# Patient Record
Sex: Male | Born: 1990
Health system: Southern US, Community
[De-identification: ages and names within clinical notes are randomized; demographics above are authoritative.]

## PROBLEM LIST (undated history)

## (undated) DIAGNOSIS — N200 Calculus of kidney: Secondary | ICD-10-CM

## (undated) DIAGNOSIS — G43909 Migraine, unspecified, not intractable, without status migrainosus: Secondary | ICD-10-CM

## (undated) HISTORY — PX: TONSILLECTOMY AND ADENOIDECTOMY: SHX28

## (undated) HISTORY — PX: LITHOTRIPSY: SUR834

---

## 2015-02-28 ENCOUNTER — Emergency Department (HOSPITAL_COMMUNITY)
Admission: EM | Admit: 2015-02-28 | Discharge: 2015-02-28 | Disposition: A | Discharge status: Home / Self Care | Payer: Commercial Managed Care - HMO | Attending: Emergency Medicine | Admitting: Emergency Medicine

## 2015-02-28 ENCOUNTER — Emergency Department (HOSPITAL_COMMUNITY): Payer: Commercial Managed Care - HMO

## 2015-02-28 ENCOUNTER — Encounter (HOSPITAL_COMMUNITY): Payer: Self-pay | Admitting: Emergency Medicine

## 2015-02-28 DIAGNOSIS — R51 Headache: Secondary | ICD-10-CM | POA: Insufficient documentation

## 2015-02-28 DIAGNOSIS — Z8679 Personal history of other diseases of the circulatory system: Secondary | ICD-10-CM | POA: Insufficient documentation

## 2015-02-28 DIAGNOSIS — H53149 Visual discomfort, unspecified: Secondary | ICD-10-CM | POA: Insufficient documentation

## 2015-02-28 DIAGNOSIS — R110 Nausea: Secondary | ICD-10-CM | POA: Diagnosis not present

## 2015-02-28 DIAGNOSIS — R519 Headache, unspecified: Secondary | ICD-10-CM

## 2015-02-28 HISTORY — DX: Migraine, unspecified, not intractable, without status migrainosus: G43.909

## 2015-02-28 MED ORDER — KETOROLAC TROMETHAMINE 30 MG/ML IJ SOLN
30.0000 mg | Freq: Once | INTRAMUSCULAR | Status: AC
  Administered 2015-02-28: 30 mg via INTRAVENOUS
  Filled 2015-02-28: qty 1

## 2015-02-28 MED ORDER — DIPHENHYDRAMINE HCL 50 MG/ML IJ SOLN
25.0000 mg | Freq: Once | INTRAMUSCULAR | Status: AC
  Filled 2015-02-28: qty 1

## 2015-02-28 MED ORDER — METOCLOPRAMIDE HCL 5 MG/ML IJ SOLN
10.0000 mg | INTRAMUSCULAR | Status: AC
  Administered 2015-02-28: 10 mg via INTRAVENOUS
  Filled 2015-02-28: qty 2

## 2015-02-28 MED ORDER — BUTALBITAL-APAP-CAFFEINE 50-325-40 MG PO TABS: 1.0000 | ORAL_TABLET | Freq: Four times a day (QID) | ORAL | Status: AC | PRN

## 2015-02-28 MED ORDER — DIPHENHYDRAMINE HCL 50 MG/ML IJ SOLN
50.0000 mg | Freq: Once | INTRAMUSCULAR | Status: AC
  Administered 2015-02-28: 50 mg via INTRAVENOUS

## 2015-02-28 NOTE — Discharge Instructions (Signed)

## 2015-02-28 NOTE — ED Notes (Signed)
Pt. reports intermittent /progressing migraine headache for 1 month worse this week , denies nausea or vomitting , no blurred vision .

## 2015-02-28 NOTE — ED Notes (Signed)
Pt A&Ox4, ambulatory at d/c with steady gait, NAD, states he has all of his belongings at discharge

## 2015-02-28 NOTE — ED Provider Notes (Signed)
CSN: [id number]     Arrival date & time 02/28/15  2032 History   First MD Initiated Contact with Patient 02/28/15 2101     Chief Complaint  Patient presents with  . Migraine     (Consider location/radiation/quality/duration/timing/severity/associated sxs/prior Treatment) HPI Comments: 24 year old male presents to emergency department for further evaluation of headache. Patient states that he had a history of migraine headaches in high school which are located in the temporal region and relieved with NSAIDs or Tylenol. He reports that, for the last month, he has intermittently been experiencing headaches in the center of his forehead. He states that the headaches are sudden in onset lasting anywhere from 20-60 minutes before spontaneously resolving. Patient states that ibuprofen has not provided any relief of his symptoms. He has had some nausea as well as photophobia. He denies fever, vision loss, tinnitus or hearing loss, difficulty speaking or swallowing, vomiting, extremity numbness/paresthesias, or extremity weakness. He has had no head injury or trauma inciting his symptoms. He states that these headaches do not feel similar to his known headaches from high school. He is not currently being followed by a primary care provider.  Patient is a 24 y.o. male presenting with migraines. The history is provided by the patient. No language interpreter was used.  Migraine Associated symptoms include headaches and nausea.    Past Medical History  Diagnosis Date  . Migraine headache    History reviewed. No pertinent past surgical history. No family history on file. Social History  Substance Use Topics  . Smoking status: None  . Smokeless tobacco: Current User    Types: Chew  . Alcohol Use: Yes    Review of Systems  Eyes: Positive for photophobia.  Gastrointestinal: Positive for nausea.  Neurological: Positive for headaches.  All other systems reviewed and are negative.   Allergies   Reglan  Home Medications   Prior to Admission medications   Medication Sig Start Date End Date Taking? Authorizing Provider  butalbital-acetaminophen-caffeine (FIORICET) 50-325-40 MG tablet Take 1-2 tablets by mouth every 6 (six) hours as needed for headache. Do not take more than 6 tablets total per day 02/28/15 02/28/16  Antony Madura, PA-C   BP 111/64 mmHg  Pulse 64  Temp(Src) 98.3 F (36.8 C) (Oral)  Resp 16  Ht  (1.626 m)  Wt 155 lb (70.308 kg)  BMI 26.59 kg/m2  SpO2 98%   Physical Exam  Constitutional: He is oriented to person, place, and time. He appears well-developed and well-nourished. No distress.  Nontoxic/nonseptic appearing  HENT:  Head: Normocephalic and atraumatic.  Mouth/Throat: Oropharynx is clear and moist. No oropharyngeal exudate.  No hemotympanum bilaterally. Symmetric rise of the uvula with phonation.  Eyes: Conjunctivae and EOM are normal. Pupils are equal, round, and reactive to light. No scleral icterus.  Normal EOMs. No nystagmus noted  Neck: Normal range of motion.  No nuchal rigidity or meningismus  Cardiovascular: Normal rate, regular rhythm and intact distal pulses.   Pulmonary/Chest: Effort normal. No respiratory distress. He has no wheezes.  Respirations even and unlabored  Musculoskeletal: Normal range of motion.  Neurological: He is alert and oriented to person, place, and time. No cranial nerve deficit. He exhibits normal muscle tone. Coordination normal.  GCS 15. Speech is goal oriented. No cranial nerve deficits appreciated; symmetric eyebrow raise, no facial drooping, tongue midline. Patient has equal grip strength bilaterally with 5/5 strength against resistance in all major muscle groups bilaterally. Sensation to light touch intact. Patient moves extremities without  ataxia. Normal finger-nose-finger. No pronator drift. Patient ambulatory with steady gait.  Skin: Skin is warm and dry. No rash noted. He is not diaphoretic. No erythema. No  pallor.  Psychiatric: He has a normal mood and affect. His behavior is normal.  Nursing note and vitals reviewed.   ED Course  Procedures (including critical care time) Labs Review Labs Reviewed - No data to display  Imaging Review Ct Head Wo Contrast  02/28/2015   CLINICAL DATA:  Initial evaluation for acute headache.  EXAM: CT HEAD WITHOUT CONTRAST  TECHNIQUE: Contiguous axial images were obtained from the base of the skull through the vertex without intravenous contrast.  COMPARISON:  None.  FINDINGS: Study is mildly motion degraded.  There is no acute intracranial hemorrhage or infarct. No mass lesion or midline shift. Gray-white matter differentiation is well maintained. Ventricles are normal in size without evidence of hydrocephalus. CSF containing spaces are within normal limits. No extra-axial fluid collection.  The calvarium is intact.  Orbital soft tissues are within normal limits.  The paranasal sinuses and mastoid air cells are well pneumatized and free of fluid.  Scalp soft tissues are unremarkable.  IMPRESSION: Negative head CT with no acute intracranial process identified.   Electronically Signed   By: Rise Mu M.D.   On: 02/28/2015 22:39     I have personally reviewed and evaluated these images and lab results as part of my medical decision-making.   EKG Interpretation None      MDM   Final diagnoses:  Acute nonintractable headache, unspecified headache type    24 year old male presents to the emergency department for further evaluation of headache. Patient states that he has a history of migraine headaches in high school, but these resolved approximately 7 years ago. He states that his headaches over the last month has felt different. They are present in the center of his forehead. Patient has nonfocal neurologic exam today. CT head ordered given the abnormal nature of symptoms compared to prior headaches. CT negative for mass, lesion, hemorrhage, or  hydrocephalus. There is no mass effect.   Patient has had resolution of his headache with Toradol and Reglan. Patient required IV Benadryl for dystonic reaction to Reglan. These have since resolved. Patient states that he is feeling back to normal, baseline. Given persistent nature of headache over the last month, will refer to neurology for further evaluation of symptoms. Patient placed on course of Fioricet for headache management. Return precautions discussed and provided. Patient agreeable to plan with no unaddressed concerns. Patient discharged in good condition.   Filed Vitals:   02/28/15 2115 02/28/15 2130 02/28/15 2200 02/28/15 2245  BP: 129/78 124/75 140/92 111/64  Pulse: 69 62 110 64  Temp:      TempSrc:      Resp:      Height:      Weight:      SpO2: 100% 98% 91% 98%     Antony Madura, PA-C 02/28/15 2314  Eber Hong, MD 03/01/15 [id number]

## 2015-02-28 NOTE — ED Notes (Signed)
Patient unable to lay still, diaphoretic then gets the chills.  Dr Hyacinth Meeker aware

## 2015-08-06 ENCOUNTER — Emergency Department (HOSPITAL_COMMUNITY)
Admission: EM | Admit: 2015-08-06 | Discharge: 2015-08-06 | Disposition: A | Discharge status: Home / Self Care | Payer: Commercial Managed Care - HMO | Source: Home / Self Care | Attending: Family Medicine | Admitting: Family Medicine

## 2015-08-06 ENCOUNTER — Encounter (HOSPITAL_COMMUNITY): Payer: Self-pay | Admitting: Emergency Medicine

## 2015-08-06 DIAGNOSIS — J111 Influenza due to unidentified influenza virus with other respiratory manifestations: Secondary | ICD-10-CM

## 2015-08-06 NOTE — ED Provider Notes (Signed)
CSN: [id number]     Arrival date & time 08/06/15  1320 History   First MD Initiated Contact with Patient 08/06/15 1406     Chief Complaint  Patient presents with  . URI   (Consider location/radiation/quality/duration/timing/severity/associated sxs/prior Treatment) HPI Thomas Barrera is a 25 year old Caucasian male with PMH of migraines, presenting with URI symptoms. Last night he had a fever of 101.19F that was relieved with Tylenol. He also reports chills, fatigue, headache (not relieved by his regular Excedrin), body aches, abdominal pain, nausea, vomiting, loose stools 6 times since last night. He has a mild cough lingering from a cold he had 1 month ago. He denies ear symptoms, rhinorrhea, SOB, chest pain. He has not tried his Fioricet for this current headache.  His wife and 4yo son were diagnosed here Wed and Thurs with the flu and treated with Tamiflu; it seems to have really helped his son. He has an 6340-month old, too.  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   Past Medical History  Diagnosis Date  . Migraine headache    History reviewed. No pertinent past surgical history. No family history on file. Social History  Substance Use Topics  . Smoking status: Never Smoker   . Smokeless tobacco: Current User    Types: Chew  . Alcohol Use: Yes    Review of Systems  Allergies  Reglan  Home Medications   Prior to Admission medications   Medication Sig Start Date End Date Taking? Authorizing Provider  butalbital-acetaminophen-caffeine (FIORICET) 50-325-40 MG tablet Take 1-2 tablets by mouth every 6 (six) hours as needed for headache. Do not take more than 6 tablets total per day 02/28/15 02/28/16  Antony MaduraKelly Humes, PA-C   Meds Ordered and Administered this Visit  Medications - No data to display  BP 134/82 mmHg  Pulse 97  Temp(Src) 98.3 F (36.8 C) (Oral)  Resp 18  SpO2 100% No data found.   Physical Exam Constitutional: Well-appearing male,  pleasant, in no acute distress, appears fatigued HEENT: PERRL; EOMs intact;TM's gray (L TM exhibits old scarring); nasal turbinates not swollen or erythematous; pharynx non-erythematous and no exudates Neck: Positive for mild cervical lymphadenopathy CV: RRR, no murmurs, rubs, gallops Lungs: CTAB, no wheezes/rales/rhonchi Abdomen: Soft and non-distended; no guarding; positive for mild pain in LLQ with deep palpation Skin: No rash noted Psych: Normal mood and affect  ED Course  Procedures (including critical care time)  Labs Review Labs Reviewed - No data to display  Imaging Review No results found.   Visual Acuity Review  Right Eye Distance:   Left Eye Distance:   Bilateral Distance:    Right Eye Near:   Left Eye Near:    Bilateral Near:       Given his positive exposures, it is reasonable to suspect he also has influenza. We discussed that Tamiflu is not recommended as he is not a high-risk patient, and he agreed to trying symptomatic treatment (Tylenol, Ibuprofen, rest).  MDM   1. Flu         Patient is reassured that there are no issues that require transfer to higher level of care at this time or additional tests. Patient is advised to continue home symptomatic treatment. Patient is advised that if there are new or worsening symptoms to attend the emergency department, contact primary care provider, or return to UC. Instructions of care provided discharged home in stable condition. Return to work/school note provided.   THIS NOTE  WAS GENERATED USING A VOICE RECOGNITION SOFTWARE PROGRAM. ALL REASONABLE EFFORTS  WERE MADE TO PROOFREAD THIS DOCUMENT FOR ACCURACY.  I have verbally reviewed the discharge instructions with the patient. A printed AVS was given to the patient.  All questions were answered prior to discharge.      Tharon Aquas, PA 08/06/15 1531

## 2015-08-06 NOTE — Discharge Instructions (Signed)

## 2015-08-06 NOTE — ED Notes (Signed)
Pt reports poss flu like sx onset yest... States son and wife have been dx w/flu Sx today include: intermittent fevers, BA, HA, chills, abd pain, vomiting and feeling tired Taking tylenol w/temp relief.  A&O x4.... No acute distress.

## 2015-09-21 ENCOUNTER — Encounter: Payer: Self-pay | Admitting: Family Medicine

## 2015-09-21 ENCOUNTER — Ambulatory Visit (INDEPENDENT_AMBULATORY_CARE_PROVIDER_SITE_OTHER): Payer: Commercial Managed Care - HMO | Admitting: Family Medicine

## 2015-09-21 VITALS — BP 110/80 | HR 62 | Temp 98.3°F | Resp 14 | Ht 66.0 in | Wt 161.0 lb

## 2015-09-21 DIAGNOSIS — M722 Plantar fascial fibromatosis: Secondary | ICD-10-CM

## 2015-09-21 DIAGNOSIS — Z0000 Encounter for general adult medical examination without abnormal findings: Secondary | ICD-10-CM | POA: Diagnosis not present

## 2015-09-21 DIAGNOSIS — G43009 Migraine without aura, not intractable, without status migrainosus: Secondary | ICD-10-CM | POA: Diagnosis not present

## 2015-09-21 MED ORDER — RIZATRIPTAN BENZOATE 10 MG PO TBDP: 10.0000 mg | ORAL_TABLET | ORAL | Status: AC | PRN

## 2015-09-21 NOTE — Progress Notes (Signed)
Subjective:    Patient ID: Thomas Barrera, male    DOB: [birth year], 25 y.o.   MRN: [id number]  HPI  Patient is a 25 year old male here today to establish care. He is requesting a complete physical exam. He also reports a past medical history significant for migraines. He tends to have 2-3 per month. If he takes Fioricet for migraine subsides immediately. However he hates to take that medication because it gives him diarrhea. He denies any aura but he does report photophobia and a unilateral pulsatile headache. He has had migraines for years. He also complains of pain in his left heel near the insertion of the plantar fascia. It is extremely tender to palpation in this area. It hurts first 20 gives up in the morning. After the first few steps the area limber up and feels sore the rest of the day. He is tried no treatment for the present. Otherwise he is doing well with no complaints Past Medical History  Diagnosis Date  . Migraine headache    History reviewed. No pertinent past surgical history. Current Outpatient Prescriptions on File Prior to Visit  Medication Sig Dispense Refill  . butalbital-acetaminophen-caffeine (FIORICET) 50-325-40 MG tablet Take 1-2 tablets by mouth every 6 (six) hours as needed for headache. Do not take more than 6 tablets total per day 20 tablet 0   No current facility-administered medications on file prior to visit.   Allergies  Allergen Reactions  . Reglan [Metoclopramide] Other (See Comments)   Social History   Social History  . Marital Status: Married    Spouse Name: N/A  . Number of Children: N/A  . Years of Education: N/A   Occupational History  . Not on file.   Social History Main Topics  . Smoking status: Never Smoker   . Smokeless tobacco: Current User    Types: Snuff  . Alcohol Use: 0.0 oz/week    0 Standard drinks or equivalent per week  . Drug Use: No  . Sexual Activity: Yes     Comment: married, fireman   Other Topics Concern  . Not on  file   Social History Narrative   FAMH-not contributory  Review of Systems  All other systems reviewed and are negative.      Objective:   Physical Exam  Constitutional: He is oriented to person, place, and time. He appears well-developed and well-nourished. No distress.  HENT:  Head: Normocephalic and atraumatic.  Right Ear: External ear normal.  Left Ear: External ear normal.  Nose: Nose normal.  Mouth/Throat: Oropharynx is clear and moist. No oropharyngeal exudate.  Eyes: Conjunctivae and EOM are normal. Pupils are equal, round, and reactive to light. Right eye exhibits no discharge. Left eye exhibits no discharge. No scleral icterus.  Neck: Normal range of motion. Neck supple. No JVD present. No tracheal deviation present. No thyromegaly present.  Cardiovascular: Normal rate, regular rhythm, normal heart sounds and intact distal pulses.  Exam reveals no gallop and no friction rub.   No murmur heard. Pulmonary/Chest: Effort normal and breath sounds normal. No stridor. No respiratory distress. He has no wheezes. He has no rales. He exhibits no tenderness.  Abdominal: Soft. Bowel sounds are normal. He exhibits no distension and no mass. There is no tenderness. There is no rebound and no guarding. Hernia confirmed negative in the right inguinal area and confirmed negative in the left inguinal area.  Genitourinary: Testes normal and penis normal. Cremasteric reflex is present. Right testis shows no mass. Left testis  shows no mass.  Musculoskeletal: Normal range of motion. He exhibits no edema.       Left foot: There is tenderness and bony tenderness.  Lymphadenopathy:    He has no cervical adenopathy.       Right: No inguinal adenopathy present.       Left: No inguinal adenopathy present.  Neurological: He is alert and oriented to person, place, and time. He displays normal reflexes. No cranial nerve deficit. He exhibits normal muscle tone. Coordination normal.  Skin: Skin is warm.  No rash noted. He is not diaphoretic. No erythema. No pallor.  Psychiatric: He has a normal mood and affect. His behavior is normal. Judgment and thought content normal.  Vitals reviewed.         Assessment & Plan:  Migraine without aura and without status migrainosus, not intractable - Plan: rizatriptan (MAXALT-MLT) 10 MG disintegrating tablet  Plantar fasciitis of left foot  Routine general medical examination at a health care facility  Exam is completely normal. I believe the pain in his heel is due to plantar fasciitis. I gave him a series of exercises and stretches I would like him to perform at home. If he stretches or not improving things over the next 4-6 weeks, I would recommend a cortisone injection for plantar fascitis versus a podiatry referral. I gave the patient prescription for Maxalt 10 mg oral disintegrating tablets she can take 1 at the first sign of a migraine. Hopefully he will tolerate this medication better than he does Fioricet. If the frequency of the headaches increases, consider Topamax. I would like the patient to get fasting lab work. 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 I have asked him to bring that lab work by us of that I can review it.

## 2015-11-28 ENCOUNTER — Encounter (HOSPITAL_COMMUNITY): Payer: Self-pay | Admitting: Emergency Medicine

## 2015-11-28 ENCOUNTER — Emergency Department (HOSPITAL_COMMUNITY)
Admission: EM | Admit: 2015-11-28 | Discharge: 2015-11-28 | Disposition: A | Discharge status: Home / Self Care | Payer: Commercial Managed Care - HMO | Attending: Emergency Medicine | Admitting: Emergency Medicine

## 2015-11-28 DIAGNOSIS — J028 Acute pharyngitis due to other specified organisms: Secondary | ICD-10-CM | POA: Insufficient documentation

## 2015-11-28 DIAGNOSIS — J029 Acute pharyngitis, unspecified: Secondary | ICD-10-CM

## 2015-11-28 DIAGNOSIS — B9789 Other viral agents as the cause of diseases classified elsewhere: Secondary | ICD-10-CM | POA: Diagnosis not present

## 2015-11-28 DIAGNOSIS — Z79899 Other long term (current) drug therapy: Secondary | ICD-10-CM | POA: Diagnosis not present

## 2015-11-28 LAB — RAPID STREP SCREEN (MED CTR MEBANE ONLY): Streptococcus, Group A Screen (Direct): NEGATIVE

## 2015-11-28 MED ORDER — BENZONATATE 100 MG PO CAPS: 100.0000 mg | ORAL_CAPSULE | Freq: Three times a day (TID) | ORAL | Status: DC

## 2015-11-28 MED ORDER — DEXAMETHASONE SODIUM PHOSPHATE 10 MG/ML IJ SOLN
10.0000 mg | Freq: Once | INTRAMUSCULAR | Status: AC
  Administered 2015-11-28: 10 mg via INTRAMUSCULAR
  Filled 2015-11-28: qty 1

## 2015-11-28 NOTE — ED Notes (Signed)
Pt reports sore throat and chills x 3 days.

## 2015-11-28 NOTE — ED Provider Notes (Signed)
CSN: [id number]     Arrival date & time 11/28/15  [id number] History   First MD Initiated Contact with Patient 11/28/15 [phone]     Chief Complaint  Patient presents with  . Sore Throat     (Consider location/radiation/quality/duration/timing/severity/associated sxs/prior Treatment) HPI   Thomas Barrera is a 25 year old male with no significant past medical history who presents to the ED today complaining of a sore throat. Patient states that 3 days ago he woke up with a sore throat and raspy voice. He states that it is painful to swallow. He also has associated dry cough and runny nose. Yesterday he tried taking cold  Medication for his symptoms without relief. 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 He has felt feverish but has checked his temperature at home and it has always been normal. No chills or rash. No otalgia.  Past Medical History  Diagnosis Date  . Migraine headache    History reviewed. No pertinent past surgical history. No family history on file. Social History  Substance Use Topics  . Smoking status: Never Smoker   . Smokeless tobacco: Current User    Types: Snuff  . Alcohol Use: 0.0 oz/week    0 Standard drinks or equivalent per week    Review of Systems  All other systems reviewed and are negative.     Allergies  Reglan  Home Medications   Prior to Admission medications   Medication Sig Start Date End Date Taking? Authorizing Provider  butalbital-acetaminophen-caffeine (FIORICET) 50-325-40 MG tablet Take 1-2 tablets by mouth every 6 (six) hours as needed for headache. Do not take more than 6 tablets total per day 02/28/15 02/28/16  Antony MaduraKelly Humes, PA-C  rizatriptan (MAXALT-MLT) 10 MG disintegrating tablet Take 1 tablet (10 mg total) by mouth as needed for migraine. May repeat in 2 hours if needed 09/21/15   Donita BrooksWarren T Pickard, MD   BP 128/82 mmHg  Pulse 89  Temp(Src) 98.7 F (37.1 C) (Oral)  Resp 18  SpO2 100% Physical Exam  Constitutional:  He is oriented to person, place, and time. He appears well-developed and well-nourished. No distress.  HENT:  Head: Normocephalic and atraumatic.  Mouth/Throat: Uvula is midline and mucous membranes are normal. No trismus in the jaw. No dental abscesses or uvula swelling. Posterior oropharyngeal erythema present. No oropharyngeal exudate, posterior oropharyngeal edema or tonsillar abscesses.  Eyes: Conjunctivae are normal. Right eye exhibits no discharge. Left eye exhibits no discharge. No scleral icterus.  Neck: Neck supple.  Cardiovascular: Normal rate.   Pulmonary/Chest: Effort normal and breath sounds normal. No respiratory distress. He has no wheezes. He has no rales. He exhibits no tenderness.  Lymphadenopathy:    He has cervical adenopathy ( mild).  Neurological: He is alert and oriented to person, place, and time. Coordination normal.  Skin: Skin is warm and dry. No rash noted. He is not diaphoretic. No erythema. No pallor.  Psychiatric: He has a normal mood and affect. His behavior is normal.  Nursing note and vitals reviewed.   ED Course  Procedures (including critical care time) Labs Review Labs Reviewed  RAPID STREP SCREEN (NOT AT [facility])  CULTURE, GROUP A STREP [facility])    Imaging Review No results found. I have personally reviewed and evaluated these images and lab results as part of my medical decision-making.   EKG Interpretation None      MDM   Final diagnoses:  Viral pharyngitis    Pt afebrile without tonsillar exudate,  negative strep. Presents with mild cervical lymphadenopathy, & dysphagia; diagnosis of viral pharyngitis. No abx indicated. DC w symptomatic tx for pain. Rx also given for tessalon for cough.  Pt does not appear dehydrated, but did discuss importance of water rehydration. Presentation non concerning for PTA or infxn spread to soft tissue. No trismus or uvula deviation. Specific return precautions discussed. Pt able to drink water in ED without  difficulty with intact air way. Recommended PCP follow up.     Lester Kinsman [place], PA-C 11/28/15 1610  Arby Barrette, MD 11/30/15 1355

## 2015-11-28 NOTE — Discharge Instructions (Signed)
Pharyngitis Pharyngitis is redness, pain, and swelling (inflammation) of your pharynx.  CAUSES  Pharyngitis is usually caused by infection. Most of the time, these infections are from viruses (viral) and are part of a cold. However, sometimes pharyngitis is caused by bacteria (bacterial). Pharyngitis can also be caused by allergies. Viral pharyngitis may be spread from person to person by coughing, sneezing, and personal items or utensils (cups, forks, spoons, toothbrushes). Bacterial pharyngitis may be spread from person to person by more intimate contact, such as kissing.  SIGNS AND SYMPTOMS  Symptoms of pharyngitis include:   Sore throat.   Tiredness (fatigue).   Low-grade fever.   Headache.  Joint pain and muscle aches.  Skin rashes.  Swollen lymph nodes.  Plaque-like film on throat or tonsils (often seen with bacterial pharyngitis). DIAGNOSIS  Your health care provider will ask you questions about your illness and your symptoms. Your medical history, along with a physical exam, is often all that is needed to diagnose pharyngitis. Sometimes, a rapid strep test is done. Other lab tests may also be done, depending on the suspected cause.  TREATMENT  Viral pharyngitis will usually get better in 3-4 days without the use of medicine. Bacterial pharyngitis is treated with medicines that kill germs (antibiotics).  HOME CARE INSTRUCTIONS   Drink enough water and fluids to keep your urine clear or pale yellow.   Only take over-the-counter or prescription medicines as directed by your health care provider:   If you are prescribed antibiotics, make sure you finish them even if you start to feel better.   Do not take aspirin.   Get lots of rest.   Gargle with 8 oz of salt water ( tsp of salt per 1 qt of water) as often as every 1-2 hours to soothe your throat.   Throat lozenges (if you are not at risk for choking) or sprays may be used to soothe your throat. SEEK MEDICAL  CARE IF:   You have large, tender lumps in your neck.  You have a rash.  You cough up green, yellow-brown, or bloody spit. SEEK IMMEDIATE MEDICAL CARE IF:   Your neck becomes stiff.  You drool or are unable to swallow liquids.  You vomit or are unable to keep medicines or liquids down.  You have severe pain that does not go away with the use of recommended medicines.  You have trouble breathing (not caused by a stuffy nose). MAKE SURE YOU:   Understand these instructions.  Will watch your condition.  Will get help right away if you are not doing well or get worse.   This information is not intended to replace advice given to you by your health care provider. Make sure you discuss any questions you have with your health care provider.   Take cough medication as needed. Take ibuprofen as needed for throat pain and fever. Return to the ED if you experience severe worsening of your symptoms, difficulty breathing or swallowing, rash, increased fever.

## 2015-11-30 LAB — CULTURE, GROUP A STREP (THRC)

## 2015-12-01 ENCOUNTER — Ambulatory Visit: Payer: Commercial Managed Care - HMO | Admitting: Physician Assistant

## 2015-12-01 ENCOUNTER — Ambulatory Visit (INDEPENDENT_AMBULATORY_CARE_PROVIDER_SITE_OTHER): Payer: Commercial Managed Care - HMO | Admitting: Physician Assistant

## 2015-12-01 ENCOUNTER — Encounter: Payer: Self-pay | Admitting: Family Medicine

## 2015-12-01 ENCOUNTER — Encounter: Payer: Self-pay | Admitting: Physician Assistant

## 2015-12-01 VITALS — BP 122/90 | HR 80 | Temp 98.2°F | Resp 18 | Wt 158.0 lb

## 2015-12-01 DIAGNOSIS — B9689 Other specified bacterial agents as the cause of diseases classified elsewhere: Secondary | ICD-10-CM

## 2015-12-01 DIAGNOSIS — J988 Other specified respiratory disorders: Secondary | ICD-10-CM | POA: Diagnosis not present

## 2015-12-01 DIAGNOSIS — H66003 Acute suppurative otitis media without spontaneous rupture of ear drum, bilateral: Secondary | ICD-10-CM | POA: Diagnosis not present

## 2015-12-01 DIAGNOSIS — J029 Acute pharyngitis, unspecified: Secondary | ICD-10-CM | POA: Diagnosis not present

## 2015-12-01 LAB — STREP GROUP A AG, W/REFLEX TO CULT: STREGTOCOCCUS GROUP A AG SCREEN: NOT DETECTED

## 2015-12-01 MED ORDER — AMOXICILLIN 875 MG PO TABS: 875.0000 mg | ORAL_TABLET | Freq: Two times a day (BID) | ORAL | Status: DC

## 2015-12-01 MED ORDER — HYDROCODONE-HOMATROPINE 5-1.5 MG/5ML PO SYRP
5.0000 mL | ORAL_SOLUTION | Freq: Three times a day (TID) | ORAL | Status: AC | PRN
Start: 2015-12-01 — End: ?

## 2015-12-01 NOTE — Progress Notes (Signed)
Patient ID: Thomas Barrera MRN: [id number], DOB: 06-08-1990, 25 y.o. Date of Encounter: 12/01/2015, 2:44 PM    Chief Complaint:  Chief Complaint  Patient presents with  . sick x 5 days    cough, HA, sore throat  seen ED Sunday     HPI: 25 y.o. year old white male presents with above.   I reviewed his ER note from 11/28/15. At that time he was complaining of sore throat. Said that 3 days ago he woke up with sore throat and raspy voice. Said that it was painful to swallow. Also had associated dry cough and runny nose. Rapid strep test was negative. Felt to be viral illness. Do not see record of them giving him any injection but the patient states that he was given a prednisone injection as well as the [employer]. Says that that prednisone injection only helped for that day and then the symptoms were just as bad the following day again. Says Tessalon Perles does not seem to be doing anything and he continues were repetitive cough even through the visit today.  Says that now his ears hurt bilaterally-- right greater than left. He says that now when he coughs it hurts down into his chest now. That he is coughing a lot at night. Getting up no phlegm. No no fevers or chills.     Home Meds:   Outpatient Prescriptions Prior to Visit  Medication Sig Dispense Refill  . benzonatate (TESSALON) 100 MG capsule Take 1 capsule (100 mg total) by mouth every 8 (eight) hours. 21 capsule 0  . butalbital-acetaminophen-caffeine (FIORICET) 50-325-40 MG tablet Take 1-2 tablets by mouth every 6 (six) hours as needed for headache. Do not take more than 6 tablets total per day 20 tablet 0  . rizatriptan (MAXALT-MLT) 10 MG disintegrating tablet Take 1 tablet (10 mg total) by mouth as needed for migraine. May repeat in 2 hours if needed 10 tablet 0   No facility-administered medications prior to visit.    Allergies:  Allergies  Allergen Reactions  . Reglan [Metoclopramide] Other (See Comments)       Review of Systems: See HPI for pertinent ROS. All other ROS negative.    Physical Exam: Blood pressure 122/90, pulse 80, temperature 98.2 F (36.8 C), temperature source Oral, resp. rate 18, weight 158 lb (71.668 kg)., Body mass index is 25.51 kg/(m^2). General: WNWD WM. Appears in no acute distress. HEENT: Normocephalic, atraumatic, eyes without discharge, sclera non-icteric, nares are without discharge. Bilateral auditory canals clear, TM's are without perforation. Bilateral TMs bright, deep red. Left TM also with some retraction. Oral cavity moist, posterior pharynx with mild-moderate erythema. No exudate, no peritonsillar abscess. Neck: Supple. No thyromegaly. No lymphadenopathy. Lungs: Clear bilaterally to auscultation without wheezes, rales, or rhonchi. Breathing is unlabored. Heart: Regular rhythm. No murmurs, rubs, or gallops. Msk:  Strength and tone normal for age. Extremities/Skin: Warm and dry.. No rashes. Neuro: Alert and oriented X 3. Moves all extremities spontaneously. Gait is normal. CNII-XII grossly in tact. Psych:  Responds to questions appropriately with a normal affect.   Results for orders placed or performed in visit on 12/01/15  STREP GROUP A AG, W/REFLEX TO CULT  Result Value Ref Range   SOURCE THROAT    STREGTOCOCCUS GROUP A AG SCREEN Not Detected      ASSESSMENT AND PLAN:  25 y.o. year old male with  1. Sorethroat - STREP GROUP A AG, W/REFLEX TO CULT  2. Acute suppurative otitis media of  both ears without spontaneous rupture of tympanic membranes, recurrence not specified - amoxicillin (AMOXIL) 875 MG tablet; Take 1 tablet (875 mg total) by mouth 2 (two) times daily.  Dispense: 20 tablet; Refill: 0  3. Bacterial respiratory infection - amoxicillin (AMOXIL) 875 MG tablet; Take 1 tablet (875 mg total) by mouth 2 (two) times daily.  Dispense: 20 tablet; Refill: 0 - HYDROcodone-homatropine (HYCODAN) 5-1.5 MG/5ML syrup; Take 5 mLs by mouth every 8 (eight)  hours as needed for cough.  Dispense: 120 mL; Refill: 0  He is to take amoxicillin as directed and complete all of it. He is to use the Hycodan as cough suppressant. Cautioned him that it has hydrocodone in it and not to take it prior to driving or work. He works [employer] and is scheduled to work that job is a 24 hour shift this Saturday. Gave him a note to be out of work Saturday. I'll up if symptoms do not resolve upon completion of antibiotic.  Murray HodgkinsSigned, Mary Beth AchilleDixon, [place], [facility] 12/01/2015 2:44 PM

## 2015-12-06 ENCOUNTER — Encounter: Payer: Self-pay | Admitting: Family Medicine

## 2015-12-06 ENCOUNTER — Ambulatory Visit (INDEPENDENT_AMBULATORY_CARE_PROVIDER_SITE_OTHER): Payer: Commercial Managed Care - HMO | Admitting: Physician Assistant

## 2015-12-06 ENCOUNTER — Encounter: Payer: Self-pay | Admitting: Physician Assistant

## 2015-12-06 ENCOUNTER — Telehealth: Payer: Self-pay | Admitting: Family Medicine

## 2015-12-06 VITALS — BP 116/76 | Temp 98.0°F | Wt 156.0 lb

## 2015-12-06 DIAGNOSIS — J988 Other specified respiratory disorders: Secondary | ICD-10-CM | POA: Diagnosis not present

## 2015-12-06 DIAGNOSIS — B9689 Other specified bacterial agents as the cause of diseases classified elsewhere: Principal | ICD-10-CM

## 2015-12-06 MED ORDER — ALBUTEROL SULFATE HFA 108 (90 BASE) MCG/ACT IN AERS: 2.0000 | INHALATION_SPRAY | Freq: Four times a day (QID) | RESPIRATORY_TRACT | Status: DC | PRN

## 2015-12-06 MED ORDER — AZITHROMYCIN 250 MG PO TABS: ORAL_TABLET | ORAL | Status: DC

## 2015-12-06 NOTE — Progress Notes (Signed)
Patient ID: Lowella GripCody Hentz MRN: [id number], DOB: 06-04-1990, 25 y.o. Date of Encounter: 12/06/2015, 4:46 PM    Chief Complaint:  Chief Complaint  Patient presents with  . OTHER    F/U     HPI: 25 y.o. year old white male presents with above.   12/01/2015:         I reviewed his ER note from 11/28/15. At that time he was complaining of sore throat. Said that 3 days ago he woke up with sore throat and raspy voice. Said that it was painful to swallow. Also had associated dry cough and runny nose. Rapid strep test was negative. Felt to be viral illness. Do not see record of them giving him any injection but the patient states that he was given a prednisone injection as well as the [employer] Per[employer]. Says that that prednisone injection only helped for that day and then the symptoms were just as bad the following day again. Says Tessalon Perles does not seem to be doing anything and he continues were repetitive cough even through the visit today.  Says that now his ears hurt bilaterally-- right greater than left. He says that now when he coughs it hurts down into his chest now. That he is coughing a lot at night. Getting up no phlegm. No no fevers or chills.   At that OV: Rapid strep test was negative. Prescribed amoxicillin 875 mg twice a day.  12/06/2015: Patient states that after taking a couple of doses of amoxicillin he could tell that his ears were improving. However he says that his ear started hurting again yesterday. Says the sore throat has never really gotten better. Says now he has developed increased chest congestion. Says that one morning recently his chest felt tight like he was having wheezing. Says he can't go to work tomorrow to work 24 hour shift with the [employer]. Says that he has been taking the amoxicillin as directed. Says that for couple of days he thought he was feeling better but now symptoms are worse.  Home Meds:   Outpatient Prescriptions Prior to Visit    Medication Sig Dispense Refill  . amoxicillin (AMOXIL) 875 MG tablet Take 1 tablet (875 mg total) by mouth 2 (two) times daily. 20 tablet 0  . butalbital-acetaminophen-caffeine (FIORICET) 50-325-40 MG tablet Take 1-2 tablets by mouth every 6 (six) hours as needed for headache. Do not take more than 6 tablets total per day 20 tablet 0  . rizatriptan (MAXALT-MLT) 10 MG disintegrating tablet Take 1 tablet (10 mg total) by mouth as needed for migraine. May repeat in 2 hours if needed 10 tablet 0  . benzonatate (TESSALON) 100 MG capsule Take 1 capsule (100 mg total) by mouth every 8 (eight) hours. (Patient not taking: Reported on 12/06/2015) 21 capsule 0  . HYDROcodone-homatropine (HYCODAN) 5-1.5 MG/5ML syrup Take 5 mLs by mouth every 8 (eight) hours as needed for cough. (Patient not taking: Reported on 12/06/2015) 120 mL 0   No facility-administered medications prior to visit.    Allergies:  Allergies  Allergen Reactions  . Reglan [Metoclopramide] Other (See Comments)      Review of Systems: See HPI for pertinent ROS. All other ROS negative.    Physical Exam: Blood pressure 116/76, temperature 98 F (36.7 C), weight 156 lb (70.761 kg)., Body mass index is 25.19 kg/(m^2). General: WNWD WM. Appears in no acute distress. HEENT: Normocephalic, atraumatic, eyes without discharge, sclera non-icteric, nares are without discharge. Bilateral auditory canals clear,  TM's are without perforation. Bilateral TMs are improved since LOV---TMs now with no erythema. Right TM does appear retracted.  Oral cavity moist, posterior pharynx with mild-moderate erythema. No exudate, no peritonsillar abscess. Neck: Supple. No thyromegaly. No lymphadenopathy. Lungs: Clear bilaterally to auscultation without wheezes, rales, or rhonchi. Breathing is unlabored. Heart: Regular rhythm. No murmurs, rubs, or gallops. Msk:  Strength and tone normal for age. Extremities/Skin: Warm and dry.. No rashes. Neuro: Alert and oriented  X 3. Moves all extremities spontaneously. Gait is normal. CNII-XII grossly in tact. Psych:  Responds to questions appropriately with a normal affect.   1. Bacterial respiratory infection Change antibiotic to azithromycin. Will also give him albuterol to have available to use if needed. Up if symptoms worsen further or if they do not resolve within 1 week after completion of antibiotic. Note given for out of work tomorrow Tuesday, July 18. He is already scheduled to be off work Wednesday and Thursday. If not improved by Thursday then he will follow-up with [place] at that time. - azithromycin (ZITHROMAX) 250 MG tablet; Day 1: Take 2 daily. Days 2-5: Take 1 daily.  Dispense: 6 tablet; Refill: 0 - albuterol (PROVENTIL HFA;VENTOLIN HFA) 108 (90 Base) MCG/ACT inhaler; Inhale 2 puffs into the lungs every 6 (six) hours as needed for wheezing or shortness of breath.  Dispense: 1 Inhaler; Refill: 0    Signed, [address] [place], [place], [facility] 12/06/2015 4:46 PM

## 2015-12-23 ENCOUNTER — Encounter: Payer: Self-pay | Admitting: Family Medicine

## 2016-04-16 ENCOUNTER — Emergency Department (HOSPITAL_COMMUNITY)
Admission: EM | Admit: 2016-04-16 | Discharge: 2016-04-16 | Disposition: A | Discharge status: Home / Self Care | Payer: Commercial Managed Care - HMO | Attending: Emergency Medicine | Admitting: Emergency Medicine

## 2016-04-16 ENCOUNTER — Encounter (HOSPITAL_COMMUNITY): Payer: Self-pay

## 2016-04-16 DIAGNOSIS — Z79899 Other long term (current) drug therapy: Secondary | ICD-10-CM | POA: Insufficient documentation

## 2016-04-16 DIAGNOSIS — R195 Other fecal abnormalities: Secondary | ICD-10-CM | POA: Diagnosis not present

## 2016-04-16 MED ORDER — DICYCLOMINE HCL 20 MG PO TABS: 20.0000 mg | ORAL_TABLET | Freq: Two times a day (BID) | ORAL | 0 refills | Status: AC

## 2016-04-16 NOTE — ED Provider Notes (Signed)
[facility]EMERGENCY DEPT Provider Note   CSN: [id number] Arrival date & time: 04/16/16  [id number]     History   Chief Complaint No chief complaint on file.   HPI Thomas Barrera is a 25 y.o. male.  HPI   25 year old male presenting for evaluation of "GI issues". Patient reports that he has always had trouble with his stomach. States he normally has to use the bathroom at least 5-7 times daily especially after he eats. Endorse occasional constipation and diarrhea. For the past week he has had more loose stools than usual. He also endorsed rectal itchiness. Last night when he had a bowel movement, he noticed some mucus from his stool which concerns him. He has never had this before. He denies any associated fever, chills, lightheadedness, dizziness, abd pain, back pain, rectal bleeding, black tarry stool, or rash. Patient denies any recent travel or eating exotic food. Denies any increasing stress. He admits that he does eat a healthy diet but denies alcohol abuse. He has never been diagnosed with your bowel syndrome in the past. He does not have a GI specialist.  Past Medical History:  Diagnosis Date  . Migraine headache     There are no active problems to display for this patient.   Past Surgical History:  Procedure Laterality Date  . TONSILLECTOMY AND ADENOIDECTOMY     25 yo       Home Medications    Prior to Admission medications   Medication Sig Start Date End Date Taking? Authorizing Provider  albuterol (PROVENTIL HFA;VENTOLIN HFA) 108 (90 Base) MCG/ACT inhaler Inhale 2 puffs into the lungs every 6 (six) hours as needed for wheezing or shortness of breath. 12/06/15   Thomas ClanMary B Dixon, PA-C  amoxicillin (AMOXIL) 875 MG tablet Take 1 tablet (875 mg total) by mouth 2 (two) times daily. 12/01/15   Thomas ClanMary B Dixon, PA-C  azithromycin (ZITHROMAX) 250 MG tablet Day 1: Take 2 daily. Days 2-5: Take 1 daily. 12/06/15   Thomas ClanMary B Dixon, PA-C  benzonatate (TESSALON) 100 MG capsule Take 1 capsule (100 mg  total) by mouth every 8 (eight) hours. Patient not taking: Reported on 12/06/2015 11/28/15   Thomas Tripp Dowless, PA-C  HYDROcodone-homatropine [facility](HYCODAN) 5-1.5 MG/5ML syrup Take 5 mLs by mouth every 8 (eight) hours as needed for cough. Patient not taking: Reported on 12/06/2015 12/01/15   Thomas BodoMary B Dixon, PA-C  rizatriptan (MAXALT-MLT) 10 MG disintegrating tablet Take 1 tablet (10 mg total) by mouth as needed for migraine. May repeat in 2 hours if needed 09/21/15   Thomas BrooksWarren T Pickard, MD    Family History Family History  Problem Relation Age of Onset  . Arthritis Mother   . Arthritis Father   . Arthritis Maternal Grandmother   . Breast cancer Maternal Grandmother   . Arthritis Maternal Grandfather   . Hyperlipidemia Maternal Grandfather   . Arthritis Paternal Grandmother   . Arthritis Paternal Grandfather     Social History Social History  Substance Use Topics  . Smoking status: Never Smoker  . Smokeless tobacco: Current User    Types: Snuff  . Alcohol use 0.0 oz/week     Allergies   Reglan [metoclopramide]   Review of Systems Review of Systems  All other systems reviewed and are negative.    Physical Exam Updated Vital Signs BP 128/81 (BP Location: Right Arm)   Pulse 104   Temp 98.2 F (36.8 C) (Oral)   Resp 14   SpO2 100%   Physical Exam  Constitutional: He  appears well-developed and well-nourished. No distress.  HENT:  Head: Atraumatic.  Eyes: Conjunctivae are normal.  Neck: Neck supple.  Cardiovascular: Normal rate and regular rhythm.   Pulmonary/Chest: Effort normal and breath sounds normal.  Abdominal: Soft. Bowel sounds are normal. He exhibits no distension and no mass. There is no tenderness. There is no guarding.  Genitourinary:  Genitourinary Comments: Chaperone present during exam. Normal external rectum. Normal rectal tone, no obvious mass, normal prostate. No anal fissure or thrombosed external hemorrhoid.  Neurological: He is alert.  Skin: No rash  noted.  Psychiatric: He has a normal mood and affect.  Nursing note and vitals reviewed.    ED Treatments / Results  Labs (all labs ordered are listed, but only abnormal results are displayed) Labs Reviewed - No data to display  EKG  EKG Interpretation None       Radiology No results found.  Procedures Procedures (including critical care time)  Medications Ordered in ED Medications - No data to display   Initial Impression / Assessment and Plan / ED Course  I have reviewed the triage vital signs and the nursing notes.  Pertinent labs & imaging results that were available during my care of the patient were reviewed by me and considered in my medical decision making (see chart for details).  Clinical Course     BP 128/81 (BP Location: Right Arm)   Pulse 104   Temp 98.2 F (36.8 C) (Oral)   Resp 14   SpO2 100%    Final Clinical Impressions(s) / ED Diagnoses   Final diagnoses:  Mucous in stools    New Prescriptions New Prescriptions   DICYCLOMINE (BENTYL) 20 MG TABLET    Take 1 tablet (20 mg total) by mouth 2 (two) times daily.   10:45 AM Patient here with concerns of mucus in his stool. Also endorsed some rectal itching. Report occasional specks of blood when he wipes. He has no abdominal pain or back pain or fever. No recent travel. I have low suspicion for infectious etiology causing this mucus in his rectum. Doubt hepatic etiology.  Doubt steatorrhea. I suspect his mucus is likely due to rectal irritation from persistent diarrhea causing the body to produce mucus. Suspect undiagnosed IBS. Plan to have patient follow-up with GI specialist for further care. Return precaution discussed. Patient will be discharge with Bentyl, and Preparation H.     Thomas HelperBowie Rejeana Fadness, PA-C 04/16/16 1051    Doug SouSam Jacubowitz, MD 04/16/16 1646

## 2016-04-16 NOTE — ED Notes (Signed)
PA at bedside discussing discharge with pt and family

## 2016-04-16 NOTE — Discharge Instructions (Signed)
The mucous from your stool is likely due to rectal irritation from persistent bowel movement.  Please take Bentyl to help decrease the spasm of your gut and decrease bowel movement.  Use Preparation H as needed to ease rectal itching.  Follow up with GI specialist for further care.  I suspect you have Irritable Bowel Syndrome.

## 2016-04-16 NOTE — ED Triage Notes (Signed)
Patient complains of increased daily bowel movements the past several days. Has seen mucus in stool since yesterday with rectal itching. No abdominal pain, no rectal pain, NAD

## 2016-04-16 NOTE — ED Provider Notes (Signed)
Patient has had multiple loose bowel movements daily "for all my life" he presents today as he had mucus per rectum earlier this morning. He denies pain anywhere and feels well. Patient has been on amoxicillin recently for ear infection.   Thomas SouSam Aaryn Parrilla, MD 04/16/16 1104

## 2016-08-24 ENCOUNTER — Encounter: Payer: Self-pay | Admitting: Family Medicine

## 2016-09-21 ENCOUNTER — Encounter: Payer: Self-pay | Admitting: Family Medicine

## 2016-12-05 ENCOUNTER — Encounter: Payer: Self-pay | Admitting: Family Medicine

## 2016-12-16 ENCOUNTER — Emergency Department (HOSPITAL_COMMUNITY): Payer: 59

## 2016-12-16 ENCOUNTER — Emergency Department (HOSPITAL_COMMUNITY)
Admission: EM | Admit: 2016-12-16 | Discharge: 2016-12-16 | Disposition: A | Discharge status: Home / Self Care | Payer: 59 | Attending: Emergency Medicine | Admitting: Emergency Medicine

## 2016-12-16 ENCOUNTER — Encounter (HOSPITAL_COMMUNITY): Payer: Self-pay

## 2016-12-16 DIAGNOSIS — Y93H1 Activity, digging, shoveling and raking: Secondary | ICD-10-CM | POA: Diagnosis not present

## 2016-12-16 DIAGNOSIS — S93401A Sprain of unspecified ligament of right ankle, initial encounter: Secondary | ICD-10-CM | POA: Diagnosis not present

## 2016-12-16 DIAGNOSIS — Z79899 Other long term (current) drug therapy: Secondary | ICD-10-CM | POA: Diagnosis not present

## 2016-12-16 DIAGNOSIS — Y998 Other external cause status: Secondary | ICD-10-CM | POA: Diagnosis not present

## 2016-12-16 DIAGNOSIS — W228XXA Striking against or struck by other objects, initial encounter: Secondary | ICD-10-CM | POA: Diagnosis not present

## 2016-12-16 DIAGNOSIS — Y929 Unspecified place or not applicable: Secondary | ICD-10-CM | POA: Diagnosis not present

## 2016-12-16 DIAGNOSIS — M25571 Pain in right ankle and joints of right foot: Secondary | ICD-10-CM | POA: Diagnosis present

## 2016-12-16 NOTE — ED Provider Notes (Signed)
[facility] Provider Note   CSN: [id number] Arrival date & time: 12/16/16  2026     History   Chief Complaint Chief Complaint  Patient presents with  . Ankle Pain    HPI Thomas Barrera is a 26 y.o. male who presents with 1 week of persistent right ankle pain. Patient states that approximately one week ago he was working in a ground and had an inversion injury to the ankle. Patient states that he was able to ambulated immediately after. He had been taking ibuprofen for pain relief. He reports abruptly 3 days ago his pain started becoming percussively worsening. He states that pain is temporarily relieved with ibuprofen use. Patient states that he still been able to ambulate but reports worsening pain with ambulation. Patient denies any numbness/weakness of the foot.  The history is provided by the patient.    Past Medical History:  Diagnosis Date  . Migraine headache     There are no active problems to display for this patient.   Past Surgical History:  Procedure Laterality Date  . TONSILLECTOMY AND ADENOIDECTOMY     26 yo       Home Medications    Prior to Admission medications   Medication Sig Start Date End Date Taking? Authorizing Provider  albuterol (PROVENTIL HFA;VENTOLIN HFA) 108 (90 Base) MCG/ACT inhaler Inhale 2 puffs into the lungs every 6 (six) hours as needed for wheezing or shortness of breath. 12/06/15   Dorena Bodoixon, Mary B, PA-C  amoxicillin (AMOXIL) 875 MG tablet Take 1 tablet (875 mg total) by mouth 2 (two) times daily. 12/01/15   Allayne Butcherixon, Mary B, PA-C  azithromycin (ZITHROMAX) 250 MG tablet Day 1: Take 2 daily. Days 2-5: Take 1 daily. 12/06/15   Dorena Bodoixon, Mary B, PA-C  benzonatate (TESSALON) 100 MG capsule Take 1 capsule (100 mg total) by mouth every 8 (eight) hours. Patient not taking: Reported on 12/06/2015 11/28/15   Dowless, Lelon MastSamantha Tripp, PA-C  dicyclomine (BENTYL) 20 MG tablet Take 1 tablet (20 mg total) by mouth 2 (two) times daily. 04/16/16   Fayrene Helperran, Bowie,  PA-C  HYDROcodone-homatropine [facility](HYCODAN) 5-1.5 MG/5ML syrup Take 5 mLs by mouth every 8 (eight) hours as needed for cough. Patient not taking: Reported on 12/06/2015 12/01/15   Allayne Butcherixon, Mary B, PA-C  rizatriptan (MAXALT-MLT) 10 MG disintegrating tablet Take 1 tablet (10 mg total) by mouth as needed for migraine. May repeat in 2 hours if needed 09/21/15   Donita BrooksPickard, Warren T, MD    Family History Family History  Problem Relation Age of Onset  . Arthritis Mother   . Arthritis Father   . Arthritis Maternal Grandmother   . Breast cancer Maternal Grandmother   . Arthritis Maternal Grandfather   . Hyperlipidemia Maternal Grandfather   . Arthritis Paternal Grandmother   . Arthritis Paternal Grandfather     Social History Social History  Substance Use Topics  . Smoking status: Never Smoker  . Smokeless tobacco: Current User    Types: Snuff  . Alcohol use 0.0 oz/week     Allergies   Reglan [metoclopramide]   Review of Systems Review of Systems  Musculoskeletal:       Right ankle pain  Neurological: Negative for weakness and numbness.     Physical Exam Updated Vital Signs BP 124/73 (BP Location: Left Arm)   Pulse 73   Temp 98.6 F (37 C) (Oral)   Resp 17   SpO2 98%   Physical Exam  Constitutional: He is oriented to person, place, and  time. He appears well-developed and well-nourished.  Sitting comfortably on examination table  HENT:  Head: Normocephalic and atraumatic.  Eyes: Conjunctivae and EOM are normal. Right eye exhibits no discharge. Left eye exhibits no discharge. No scleral icterus.  Cardiovascular:  Pulses:      Dorsalis pedis pulses are 2+ on the right side, and 2+ on the left side.  Pulmonary/Chest: Effort normal.  Musculoskeletal:  Soft tissue swelling overlying the lateral malleolus of the right ankle. Tenderness palpation to the lateral malleolus. No deformity or crepitus noted. No overlying warmth, erythema, ecchymosis. Flexion/extension of the right ankle  intact without difficulty. Patient is able to move all 5 toes of right foot without any difficulty. No abnormalities of the left ankle.  Neurological: He is alert and oriented to person, place, and time. GCS eye subscore is 4. GCS verbal subscore is 5. GCS motor subscore is 6.  Sensation intact throughout all major nerve distribution of the foot. Dorsiflexion and plantar flexion of right ankle intact without any difficulty.  Skin: Skin is warm and dry.  Psychiatric: He has a normal mood and affect. His speech is normal and behavior is normal.  Nursing note and vitals reviewed.    ED Treatments / Results  Labs (all labs ordered are listed, but only abnormal results are displayed) Labs Reviewed - No data to display  EKG  EKG Interpretation None       Radiology Dg Ankle Complete Right  Result Date: 12/16/2016 CLINICAL DATA:  Right ankle pain post twisting injury. EXAM: RIGHT ANKLE - COMPLETE 3+ VIEW COMPARISON:  None. FINDINGS: There is no evidence of fracture, dislocation, or joint effusion. There is no evidence of arthropathy or other focal bone abnormality. Soft tissue swelling about the lateral malleolus. IMPRESSION: No evidence of fracture of the right ankle. Soft tissue swelling about the lateral malleolus. Electronically Signed   By: Ted Mcalpineobrinka  Dimitrova M.D.   On: 12/16/2016 21:06    Procedures Procedures (including critical care time)  Medications Ordered in ED Medications - No data to display   Initial Impression / Assessment and Plan / ED Course  I have reviewed the triage vital signs and the nursing notes.  Pertinent labs & imaging results that were available during my care of the patient were reviewed by me and considered in my medical decision making (see chart for details).     26 year old male who presents with 1 week of ankle pain after an inversion ankle injury. Patient is afebrile, non-toxic appearing, sitting comfortably on examination table. Vital signs  reviewed and stable. Neurovascularly intact. Consider XR for evaluation of fracture vs sprain.   XR reviewed. Negative for any acute fracture or dislocation. Explained results to patient and discussed that there could be a ligamentous or muscular injury that cannot be picked up on XR. Plan to send patient home with a  splint and crutches and will provide ortho referral to be seen if there is no improvement in symptoms. Conservative therapy discussed. Strict return precautions discussed. Patient's breasts understanding and agreement to plan.   Final Clinical Impressions(s) / ED Diagnoses   Final diagnoses:  Sprain of right ankle, unspecified ligament, initial encounter    New Prescriptions Discharge Medication List as of 12/16/2016 10:27 PM       Maxwell CaulLayden, Ples Trudel A, PA-C 12/17/16 0304    Tilden Fossaees, Elizabeth, MD 12/17/16 1558

## 2016-12-16 NOTE — Discharge Instructions (Signed)
Follow up with your Primary Care Doctor as needed.  ° ° Follow up with referred orthopedic doctor in 1-2 weeks if no improvement of pain.  ° °You can take tylenol or ibuprofen as needed for pain.  ° °Return to the Emergency Department immediately for any worsening pain, redness/swelling of the ankle, gray or blue color to the toes, numbness/weakness of toes or foot, difficulty walking or any other worsening or concerning symptoms.  ° ° °Ankle sprain °Ankle sprain occurs when the ligaments that hold the ankle joint to get her are stretched or torn. It may take 4-6 weeks to heal. ° °For activity: Use crutches with nonweightbearing for the first few days. Then, you may walk on your ankles as the pain allows, or as instructed. Start gradually with weight bearing on the affected ankle. Once you can walk pain free, then try jogging. When you can run forwards, then you can try moving side to side. If you cannot walk without crutches in one week, you need a recheck by your Family Doctor. ° °If you do not have a family doctor to followup with, you can see the list of phone numbers below. Please call today to make a followup appointment. ° ° °RICE therapy:  Routine Care for injuries ° °Rest, Ice, Compression, Elevation (RICE) ° °Rest is needed to allow your body to heal. Routine activities can be resumed when comfortable. Injury tendons and bones can take up to 6 weeks to heal. Tendons are cordlike structures that attach muscles and bones. ° °Ice following an injury helps keep the swelling down and reduce the pain. Put ice in a plastic bag. Place a towel between your skin and the bag of ice. Leave the ice on for 15-20 minutes, 3-4 times a day. Do this while awake, for the first 24-48 hours. After that continue as directed by your caregiver. ° °Compression helps keep swelling down. It also gives support and helps with discomfort. If any lasting bandage has been applied, it should be removed and reapplied every 3-4 hours. It  should not be applied tightly, but firmly enough to keep swelling down. Watch fingers or toes for swelling, discoloration, coldness, numbness or excessive pain. If any of these problems occur, removed the bandage and reapply loosely. Contact your caregiver if these problems continue. ° °Elevation helps reduce swelling and decrease your pain. With extremities such as the arms, hands, legs and feet, the injured area should be placed near or above the level of the heart if possible. ° ° °

## 2016-12-16 NOTE — ED Triage Notes (Signed)
Pt reports right ankle pain since he twisted it when kicking a shovel into the ground about one week ago. He states the pain started again about 3 days ago. Pain with ambulation, flexion and extension. Pt is ambulatory with independent steady gait.

## 2017-06-12 DIAGNOSIS — J09X2 Influenza due to identified novel influenza A virus with other respiratory manifestations: Secondary | ICD-10-CM | POA: Diagnosis not present

## 2017-06-12 DIAGNOSIS — J029 Acute pharyngitis, unspecified: Secondary | ICD-10-CM | POA: Diagnosis not present

## 2017-08-08 ENCOUNTER — Other Ambulatory Visit: Payer: Self-pay

## 2017-08-08 ENCOUNTER — Ambulatory Visit (HOSPITAL_COMMUNITY)
Admission: EM | Admit: 2017-08-08 | Discharge: 2017-08-08 | Disposition: A | Discharge status: Home / Self Care | Payer: Commercial Managed Care - HMO | Attending: Family Medicine | Admitting: Family Medicine

## 2017-08-08 ENCOUNTER — Encounter (HOSPITAL_COMMUNITY): Payer: Self-pay | Admitting: Emergency Medicine

## 2017-08-08 DIAGNOSIS — H66003 Acute suppurative otitis media without spontaneous rupture of ear drum, bilateral: Secondary | ICD-10-CM

## 2017-08-08 DIAGNOSIS — B349 Viral infection, unspecified: Secondary | ICD-10-CM

## 2017-08-08 MED ORDER — PREDNISONE 20 MG PO TABS: 40.0000 mg | ORAL_TABLET | Freq: Every day | ORAL | 0 refills | Status: AC

## 2017-08-08 MED ORDER — FLUTICASONE PROPIONATE 50 MCG/ACT NA SUSP: 2.0000 | Freq: Every day | NASAL | 0 refills | Status: AC

## 2017-08-08 MED ORDER — AMOXICILLIN-POT CLAVULANATE 875-125 MG PO TABS: 1.0000 | ORAL_TABLET | Freq: Two times a day (BID) | ORAL | 0 refills | Status: DC

## 2017-08-08 MED ORDER — IPRATROPIUM BROMIDE 0.06 % NA SOLN: 2.0000 | Freq: Four times a day (QID) | NASAL | 0 refills | Status: AC

## 2017-08-08 NOTE — ED Provider Notes (Signed)
[facility]    CSN: [id number] Arrival date & time: 08/08/17  [id number]     History   Chief Complaint Chief Complaint  Patient presents with  . Otalgia  . Sore Throat    HPI Thomas Barrera is a 27 y.o. male.   27 year old male comes in for 1 day history of URI symptoms.  States has bilateral ear pain with sore throat.  Denies rhinorrhea, nasal congestion.  Does endorse some postnasal drip.  States has had intermittent cough more due to throat irritation.  Fever, T-max 100.8, ibuprofen.  Has also been taking Allegra for the symptoms without improvement.  Never smoker.      Past Medical History:  Diagnosis Date  . Migraine headache     There are no active problems to display for this patient.   Past Surgical History:  Procedure Laterality Date  . TONSILLECTOMY AND ADENOIDECTOMY     27 yo       Home Medications    Prior to Admission medications   Medication Sig Start Date End Date Taking? Authorizing Provider  albuterol (PROVENTIL HFA;VENTOLIN HFA) 108 (90 Base) MCG/ACT inhaler Inhale 2 puffs into the lungs every 6 (six) hours as needed for wheezing or shortness of breath. 12/06/15   Dorena Bodoixon, Mary B, PA-C  amoxicillin (AMOXIL) 875 MG tablet Take 1 tablet (875 mg total) by mouth 2 (two) times daily. 12/01/15   Allayne Butcherixon, Mary B, PA-C  amoxicillin-clavulanate (AUGMENTIN) 875-125 MG tablet Take 1 tablet by mouth every 12 (twelve) hours. 08/08/17   Cathie HoopsYu, Amy V, PA-C  azithromycin (ZITHROMAX) 250 MG tablet Day 1: Take 2 daily. Days 2-5: Take 1 daily. 12/06/15   Dorena Bodoixon, Mary B, PA-C  benzonatate (TESSALON) 100 MG capsule Take 1 capsule (100 mg total) by mouth every 8 (eight) hours. Patient not taking: Reported on 12/06/2015 11/28/15   Dowless, Lelon MastSamantha Tripp, PA-C  dicyclomine (BENTYL) 20 MG tablet Take 1 tablet (20 mg total) by mouth 2 (two) times daily. 04/16/16   Fayrene Helperran, [place], PA-C  fluticasone (FLONASE) 50 MCG/ACT nasal spray Place 2 sprays into both nostrils daily. 08/08/17   Cathie HoopsYu,  Amy V, PA-C  HYDROcodone-homatropine (HYCODAN) 5-1.5 MG/5ML syrup Take 5 mLs by mouth every 8 (eight) hours as needed for cough. Patient not taking: Reported on 12/06/2015 12/01/15   Allayne Butcherixon, Mary B, PA-C  ipratropium (ATROVENT) 0.06 % nasal spray Place 2 sprays into both nostrils 4 (four) times daily. 08/08/17   Cathie HoopsYu, Amy V, PA-C  predniSONE (DELTASONE) 20 MG tablet Take 2 tablets (40 mg total) by mouth daily for 4 days. 08/08/17 08/12/17  Cathie HoopsYu, Amy V, PA-C  rizatriptan (MAXALT-MLT) 10 MG disintegrating tablet Take 1 tablet (10 mg total) by mouth as needed for migraine. May repeat in 2 hours if needed 09/21/15   Donita BrooksPickard, Warren T, MD    Family History Family History  Problem Relation Age of Onset  . Arthritis Mother   . Arthritis Father   . Arthritis Maternal Grandmother   . Breast cancer Maternal Grandmother   . Arthritis Maternal Grandfather   . Hyperlipidemia Maternal Grandfather   . Arthritis Paternal Grandmother   . Arthritis Paternal Grandfather     Social History Social History   Tobacco Use  . Smoking status: Never Smoker  . Smokeless tobacco: Current User    Types: Snuff  Substance Use Topics  . Alcohol use: Yes    Alcohol/week: 0.0 oz  . Drug use: No     Allergies   Reglan [metoclopramide]  Review of Systems Review of Systems  Reason unable to perform ROS: See HPI as above.     Physical Exam Triage Vital Signs ED Triage Vitals  Enc Vitals Group     BP 08/08/17 1952 (!) 125/91     Pulse Rate 08/08/17 1952 (!) 110     Resp 08/08/17 1952 17     Temp 08/08/17 1952 98.2 F (36.8 C)     Temp Source 08/08/17 1952 Oral     SpO2 08/08/17 1952 100 %     Weight 08/08/17 1953 165 lb (74.8 kg)     Height 08/08/17 1953 5\' 6"  (1.676 m)     Head Circumference --      Peak Flow --      Pain Score 08/08/17 1953 8     Pain Loc --      Pain Edu? --      Excl. in GC? --    No data found.  Updated Vital Signs BP (!) 125/91 (BP Location: Left Arm)   Pulse (!) 110   Temp  98.2 F (36.8 C) (Oral)   Resp 17   Ht 5\' 6"  (1.676 m)   Wt 165 lb (74.8 kg)   SpO2 100%   BMI 26.63 kg/m   Visual Acuity Right Eye Distance:   Left Eye Distance:   Bilateral Distance:    Right Eye Near:   Left Eye Near:    Bilateral Near:     Physical Exam  Constitutional: He is oriented to person, place, and time. He appears well-developed and well-nourished. No distress.  HENT:  Head: Normocephalic and atraumatic.  Right Ear: External ear and ear canal normal. Tympanic membrane is erythematous and bulging.  Left Ear: External ear and ear canal normal. Tympanic membrane is erythematous and bulging.  Nose: Nose normal. Right sinus exhibits no maxillary sinus tenderness and no frontal sinus tenderness. Left sinus exhibits no maxillary sinus tenderness and no frontal sinus tenderness.  Mouth/Throat: Uvula is midline, oropharynx is clear and moist and mucous membranes are normal.  Eyes: Conjunctivae are normal. Pupils are equal, round, and reactive to light.  Neck: Normal range of motion. Neck supple.  Cardiovascular: Normal rate, regular rhythm and normal heart sounds. Exam reveals no gallop and no friction rub.  No murmur heard. Pulmonary/Chest: Effort normal and breath sounds normal. He has no decreased breath sounds. He has no wheezes. He has no rhonchi. He has no rales.  Lymphadenopathy:    He has no cervical adenopathy.  Neurological: He is alert and oriented to person, place, and time.  Skin: Skin is warm and dry.  Psychiatric: He has a normal mood and affect. His behavior is normal. Judgment normal.     UC Treatments / Results  Labs (all labs ordered are listed, but only abnormal results are displayed) Labs Reviewed - No data to display  EKG  EKG Interpretation None       Radiology No results found.  Procedures Procedures (including critical care time)  Medications Ordered in UC Medications - No data to display   Initial Impression / Assessment and  Plan / UC Course  I have reviewed the triage vital signs and the nursing notes.  Pertinent labs & imaging results that were available during my care of the patient were reviewed by me and considered in my medical decision making (see chart for details).    Augmentin for otitis media.  Prednisone to help with symptoms.  Other symptomatic treatment discussed.  Return  precautions given.  Patient expresses understanding and agrees to plan.  Final Clinical Impressions(s) / UC Diagnoses   Final diagnoses:  Acute suppurative otitis media of both ears without spontaneous rupture of tympanic membranes, recurrence not specified  Viral illness    ED Discharge Orders        Ordered    amoxicillin-clavulanate (AUGMENTIN) 875-125 MG tablet  Every 12 hours     08/08/17 2004    fluticasone (FLONASE) 50 MCG/ACT nasal spray  Daily     08/08/17 2004    ipratropium (ATROVENT) 0.06 % nasal spray  4 times daily     08/08/17 2004    predniSONE (DELTASONE) 20 MG tablet  Daily     08/08/17 2004        Yu, Amy V, [place] 08/08/17 2036

## 2017-08-08 NOTE — Discharge Instructions (Signed)
Start Augmentin for ear infection.  Prednisone to help with the symptoms.  Start flonase, atrovent nasal spray for nasal congestion/drainage. You can use over the counter nasal saline rinse such as neti pot for nasal congestion. Keep hydrated, your urine should be clear to pale yellow in color. Tylenol/motrin for fever and pain. Monitor for any worsening of symptoms, chest pain, shortness of breath, wheezing, swelling of the throat, follow up for reevaluation.   For sore throat try using a honey-based tea. Use 3 teaspoons of honey with juice squeezed from half lemon. Place shaved pieces of ginger into 1/2-1 cup of water and warm over stove top. Then mix the ingredients and repeat every 4 hours as needed.

## 2017-09-16 ENCOUNTER — Encounter (HOSPITAL_COMMUNITY): Payer: Self-pay | Admitting: Emergency Medicine

## 2017-09-16 ENCOUNTER — Emergency Department (HOSPITAL_COMMUNITY)
Admission: EM | Admit: 2017-09-16 | Discharge: 2017-09-16 | Disposition: A | Discharge status: Home / Self Care | Payer: 59 | Attending: Emergency Medicine | Admitting: Emergency Medicine

## 2017-09-16 ENCOUNTER — Emergency Department (HOSPITAL_COMMUNITY): Payer: 59

## 2017-09-16 DIAGNOSIS — Z79899 Other long term (current) drug therapy: Secondary | ICD-10-CM | POA: Insufficient documentation

## 2017-09-16 DIAGNOSIS — N200 Calculus of kidney: Secondary | ICD-10-CM | POA: Diagnosis not present

## 2017-09-16 DIAGNOSIS — R109 Unspecified abdominal pain: Secondary | ICD-10-CM | POA: Diagnosis not present

## 2017-09-16 DIAGNOSIS — R319 Hematuria, unspecified: Secondary | ICD-10-CM | POA: Diagnosis not present

## 2017-09-16 HISTORY — DX: Calculus of kidney: N20.0

## 2017-09-16 LAB — I-STAT CHEM 8, ED
BUN: 17 mg/dL (ref 6–20)
CALCIUM ION: 1.16 mmol/L (ref 1.15–1.40)
CHLORIDE: 101 mmol/L (ref 101–111)
CREATININE: 1 mg/dL (ref 0.61–1.24)
Glucose, Bld: 79 mg/dL (ref 65–99)
HCT: 44 % (ref 39.0–52.0)
Hemoglobin: 15 g/dL (ref 13.0–17.0)
Potassium: 4.2 mmol/L (ref 3.5–5.1)
Sodium: 140 mmol/L (ref 135–145)
TCO2: 27 mmol/L (ref 22–32)

## 2017-09-16 LAB — URINALYSIS, MICROSCOPIC (REFLEX): RBC / HPF: >50 RBC/hpf (ref 0–5)

## 2017-09-16 LAB — URINALYSIS, ROUTINE W REFLEX MICROSCOPIC

## 2017-09-16 MED ORDER — TAMSULOSIN HCL 0.4 MG PO CAPS: 0.4000 mg | ORAL_CAPSULE | Freq: Every day | ORAL | 0 refills | Status: DC

## 2017-09-16 MED ORDER — CEPHALEXIN 500 MG PO CAPS: 500.0000 mg | ORAL_CAPSULE | Freq: Three times a day (TID) | ORAL | 0 refills | Status: DC

## 2017-09-16 MED ORDER — HYDROCODONE-ACETAMINOPHEN 5-325 MG PO TABS: 1.0000 | ORAL_TABLET | Freq: Four times a day (QID) | ORAL | 0 refills | Status: DC | PRN

## 2017-09-16 MED ORDER — KETOROLAC TROMETHAMINE 30 MG/ML IJ SOLN
30.0000 mg | Freq: Once | INTRAMUSCULAR | Status: AC
  Administered 2017-09-16: 30 mg via INTRAMUSCULAR
  Filled 2017-09-16: qty 1

## 2017-09-16 NOTE — ED Provider Notes (Signed)
[facility] EMERGENCY DEPARTMENT Provider Note   CSN: [id number] Arrival date & time: 09/16/17  1314     History   Chief Complaint Chief Complaint  Patient presents with  . Flank Pain  . Hematuria    HPI Thomas Barrera is a 27 y.o. male.  HPI   27 year old male with history of kidney stones presenting complaining of right flank pain and blood in his urine.  Patient report last night he noticed some blood in his urine and today he developed acute onset of right sided flank pain.  He described pain as "you are punching me to my side" rated as 7 out of 10, and now radiates to his right side abdomen.  Pain is very intense initially, improves, and have been waxing and waning.  Pain felt similar to prior kidney stone but more intense.  Nothing seems to make it better or worse.  He does report some mild discomfort with urination.  No report of fever, chills, chest pain, trouble breathing, nausea vomiting diarrhea.  No specific treatment tried.  He mentioned in the past with kidney stones he usually able to pass it without any intervention.  He usually takes NSAIDs and drink plenty of fluid.  He denies any recent injury or any skin changes.  No recent heavy lifting.  Denies having any testicle pain  Past Medical History:  Diagnosis Date  . Kidney calculi   . Migraine headache     There are no active problems to display for this patient.   Past Surgical History:  Procedure Laterality Date  . TONSILLECTOMY AND ADENOIDECTOMY     27 yo        Home Medications    Prior to Admission medications   Medication Sig Start Date End Date Taking? Authorizing Provider  albuterol (PROVENTIL HFA;VENTOLIN HFA) 108 (90 Base) MCG/ACT inhaler Inhale 2 puffs into the lungs every 6 (six) hours as needed for wheezing or shortness of breath. 12/06/15   Dorena Bodo, PA-C  amoxicillin (AMOXIL) 875 MG tablet Take 1 tablet (875 mg total) by mouth 2 (two) times daily. 12/01/15   Allayne Butcher B,  PA-C  amoxicillin-clavulanate (AUGMENTIN) 875-125 MG tablet Take 1 tablet by mouth every 12 (twelve) hours. 08/08/17   Cathie Hoops, Amy V, PA-C  azithromycin (ZITHROMAX) 250 MG tablet Day 1: Take 2 daily. Days 2-5: Take 1 daily. 12/06/15   Dorena Bodo, PA-C  benzonatate (TESSALON) 100 MG capsule Take 1 capsule (100 mg total) by mouth every 8 (eight) hours. Patient not taking: Reported on 12/06/2015 11/28/15   Dowless, Lelon Mast Tripp, PA-C  dicyclomine (BENTYL) 20 MG tablet Take 1 tablet (20 mg total) by mouth 2 (two) times daily. 04/16/16   Fayrene Helper, PA-C  fluticasone (FLONASE) 50 MCG/ACT nasal spray Place 2 sprays into both nostrils daily. 08/08/17   Cathie Hoops, Amy V, PA-C  HYDROcodone-homatropine (HYCODAN) 5-1.5 MG/5ML syrup Take 5 mLs by mouth every 8 (eight) hours as needed for cough. Patient not taking: Reported on 12/06/2015 12/01/15   Allayne Butcher B, PA-C  ipratropium (ATROVENT) 0.06 % nasal spray Place 2 sprays into both nostrils 4 (four) times daily. 08/08/17   Cathie Hoops, Amy V, PA-C  rizatriptan (MAXALT-MLT) 10 MG disintegrating tablet Take 1 tablet (10 mg total) by mouth as needed for migraine. May repeat in 2 hours if needed 09/21/15   Donita Brooks, MD    Family History Family History  Problem Relation Age of Onset  . Arthritis Mother   .  Arthritis Father   . Arthritis Maternal Grandmother   . Breast cancer Maternal Grandmother   . Arthritis Maternal Grandfather   . Hyperlipidemia Maternal Grandfather   . Arthritis Paternal Grandmother   . Arthritis Paternal Grandfather     Social History Social History   Tobacco Use  . Smoking status: Never Smoker  . Smokeless tobacco: Current User    Types: Snuff  Substance Use Topics  . Alcohol use: Yes    Alcohol/week: 0.0 oz  . Drug use: No     Allergies   Reglan [metoclopramide]   Review of Systems Review of Systems  All other systems reviewed and are negative.    Physical Exam Updated Vital Signs BP 126/81   Pulse 75   Temp 98.3 F  (36.8 C)   Resp 18   SpO2 99%   Physical Exam  Constitutional: He appears well-developed and well-nourished. No distress.  HENT:  Head: Atraumatic.  Eyes: Conjunctivae are normal.  Neck: Neck supple.  Cardiovascular: Normal rate and regular rhythm.  Pulmonary/Chest: Effort normal and breath sounds normal.  Abdominal: Soft. Bowel sounds are normal. He exhibits no distension. There is no tenderness.  Genitourinary:  Genitourinary Comments: Right CVA tenderness   Neurological: He is alert.  Skin: No rash noted.  Psychiatric: He has a normal mood and affect.  Nursing note and vitals reviewed.    ED Treatments / Results  Labs (all labs ordered are listed, but only abnormal results are displayed) Labs Reviewed  URINALYSIS, ROUTINE W REFLEX MICROSCOPIC - Abnormal; Notable for the following components:      Result Value   Color, Urine RED (*)    APPearance CLOUDY (*)    Glucose, UA   (*)    Value: TEST NOT REPORTED DUE TO COLOR INTERFERENCE OF URINE PIGMENT   Hgb urine dipstick   (*)    Value: TEST NOT REPORTED DUE TO COLOR INTERFERENCE OF URINE PIGMENT   Bilirubin Urine   (*)    Value: TEST NOT REPORTED DUE TO COLOR INTERFERENCE OF URINE PIGMENT   Ketones, ur   (*)    Value: TEST NOT REPORTED DUE TO COLOR INTERFERENCE OF URINE PIGMENT   Protein, ur   (*)    Value: TEST NOT REPORTED DUE TO COLOR INTERFERENCE OF URINE PIGMENT   Nitrite   (*)    Value: TEST NOT REPORTED DUE TO COLOR INTERFERENCE OF URINE PIGMENT   Leukocytes, UA   (*)    Value: TEST NOT REPORTED DUE TO COLOR INTERFERENCE OF URINE PIGMENT   All other components within normal limits  URINALYSIS, MICROSCOPIC (REFLEX) - Abnormal; Notable for the following components:   Bacteria, UA RARE (*)    All other components within normal limits  I-STAT CHEM 8, ED    EKG None  Radiology Ct Renal Stone Study  Result Date: 09/16/2017 CLINICAL DATA:  Right flank pain since this morning. Gross hematuria. EXAM: CT ABDOMEN  AND PELVIS WITHOUT CONTRAST TECHNIQUE: Multidetector CT imaging of the abdomen and pelvis was performed following the standard protocol without IV contrast. COMPARISON:  None. FINDINGS: Lower chest: The lung bases are clear of acute process. No pleural effusion or pulmonary lesions. The heart is normal in size. No pericardial effusion. The distal esophagus and aorta are unremarkable. Hepatobiliary: No focal hepatic lesions or intrahepatic biliary dilatation. The gallbladder is normal. No common bile duct dilatation. Pancreas: No mass, inflammation or duct dilatation. Spleen: Normal size.  No focal lesions. Adrenals/Urinary Tract: The adrenal glands  are normal. There is a 4 mm calculus in the right renal pelvis. No obstructing ureteral calculi or bladder calculi. It is possible this stone could be intermittently obstructing at the UPJ. No left-sided renal calculi. No worrisome renal or bladder lesions without contrast. Stomach/Bowel: The stomach, duodenum, small bowel and colon are grossly normal without oral contrast. No inflammatory changes, mass lesions or obstructive findings. The terminal ileum and appendix are normal. Vascular/Lymphatic: The aorta is normal in caliber. No atheroscerlotic calcifications. No mesenteric of retroperitoneal mass or adenopathy. Small scattered lymph nodes are noted. Reproductive: The prostate gland and seminal vesicles are unremarkable. Other: No pelvic mass or adenopathy. No free pelvic fluid collections. No inguinal mass or adenopathy. No abdominal wall hernia or subcutaneous lesions. Musculoskeletal: No significant bony findings. IMPRESSION: 1. No acute abdominal/pelvic findings, mass lesions or adenopathy. 2. 4 mm calculus in the right renal pelvis but no obstructing ureteral calculi or bladder calculi. It is possible this stone could be intermittently obstructing at the UPJ. Electronically Signed   By: Rudie Meyer M.D.   On: 09/16/2017 17:38    Procedures Procedures  (including critical care time)  Medications Ordered in ED Medications  ketorolac (TORADOL) 30 MG/ML injection 30 mg (30 mg Intramuscular Given 09/16/17 1647)     Initial Impression / Assessment and Plan / ED Course  I have reviewed the triage vital signs and the nursing notes.  Pertinent labs & imaging results that were available during my care of the patient were reviewed by me and considered in my medical decision making (see chart for details).     BP 126/81   Pulse 75   Temp 98.3 F (36.8 C)   Resp 18   SpO2 99%    Final Clinical Impressions(s) / ED Diagnoses   Final diagnoses:  Kidney stone on right side    ED Discharge Orders        Ordered    HYDROcodone-acetaminophen (NORCO/VICODIN) 5-325 MG tablet  Every 6 hours PRN     09/16/17 1852    tamsulosin (FLOMAX) 0.4 MG CAPS capsule  Daily     09/16/17 1852     4:33 PM Patient with known history of kidney stones here with right flank pain and urine that shows hematuria.  That is suggestive of a kidney stone.  He does have 11-20 WBC and rare bacteria.  He did mention some mild burning urination.  Will consider antibiotic use.  CT renal stone study ordered.  Patient given Toradol IM.   6:59 PM CT scan demonstrate a nonobstructive 4 mm stone on the right side kidney near the UPJ which may cause his pain however there is no hydronephrosis.  I discussed this with patient.  Will prescribe pain medication, Flomax at discharge and patient will follow-up with urologist.  Since he did endorse some urinary discomfort as well as having signs of potential urinary tract infection, Keflex prescribed.  Return precautions discussed.   Fayrene Helper, PA-C 09/16/17 1900    Mancel Bale, MD 09/17/17 [phone]

## 2017-09-16 NOTE — Discharge Instructions (Signed)
You have a 4mm kidney stone on the right side.  Please use urine strainer and follow up with urologist for further care.

## 2017-09-16 NOTE — ED Notes (Signed)
Patient transported to CT 

## 2017-09-16 NOTE — ED Triage Notes (Signed)
Pt states hematuria starting last night with right sided flank pain starting today. Pain 4/10 to right flank. Pt has a hx of kidney stones but states the pain has never been this bad. Normally passes them.

## 2017-12-07 DIAGNOSIS — N200 Calculus of kidney: Secondary | ICD-10-CM | POA: Diagnosis not present

## 2017-12-07 DIAGNOSIS — Z87891 Personal history of nicotine dependence: Secondary | ICD-10-CM | POA: Diagnosis not present

## 2017-12-07 DIAGNOSIS — Z87442 Personal history of urinary calculi: Secondary | ICD-10-CM | POA: Diagnosis not present

## 2017-12-07 DIAGNOSIS — N132 Hydronephrosis with renal and ureteral calculous obstruction: Secondary | ICD-10-CM | POA: Diagnosis not present

## 2017-12-07 DIAGNOSIS — R300 Dysuria: Secondary | ICD-10-CM | POA: Diagnosis not present

## 2017-12-10 DIAGNOSIS — N200 Calculus of kidney: Secondary | ICD-10-CM | POA: Diagnosis not present

## 2017-12-13 DIAGNOSIS — N132 Hydronephrosis with renal and ureteral calculous obstruction: Secondary | ICD-10-CM | POA: Diagnosis not present

## 2017-12-13 DIAGNOSIS — N200 Calculus of kidney: Secondary | ICD-10-CM | POA: Diagnosis not present

## 2018-01-02 DIAGNOSIS — Z87891 Personal history of nicotine dependence: Secondary | ICD-10-CM | POA: Diagnosis not present

## 2018-01-02 DIAGNOSIS — N201 Calculus of ureter: Secondary | ICD-10-CM | POA: Diagnosis not present

## 2018-01-02 DIAGNOSIS — N132 Hydronephrosis with renal and ureteral calculous obstruction: Secondary | ICD-10-CM | POA: Diagnosis not present

## 2018-01-02 DIAGNOSIS — N202 Calculus of kidney with calculus of ureter: Secondary | ICD-10-CM | POA: Diagnosis not present

## 2018-01-02 DIAGNOSIS — N131 Hydronephrosis with ureteral stricture, not elsewhere classified: Secondary | ICD-10-CM | POA: Diagnosis not present

## 2019-07-01 ENCOUNTER — Emergency Department (HOSPITAL_COMMUNITY)
Admission: EM | Admit: 2019-07-01 | Discharge: 2019-07-01 | Disposition: A | Discharge status: Home / Self Care | Payer: 59 | Attending: Emergency Medicine | Admitting: Emergency Medicine

## 2019-07-01 ENCOUNTER — Encounter (HOSPITAL_COMMUNITY): Payer: Self-pay | Admitting: Emergency Medicine

## 2019-07-01 DIAGNOSIS — Y999 Unspecified external cause status: Secondary | ICD-10-CM | POA: Insufficient documentation

## 2019-07-01 DIAGNOSIS — W3400XA Accidental discharge from unspecified firearms or gun, initial encounter: Secondary | ICD-10-CM | POA: Insufficient documentation

## 2019-07-01 DIAGNOSIS — S0081XA Abrasion of other part of head, initial encounter: Secondary | ICD-10-CM | POA: Diagnosis present

## 2019-07-01 DIAGNOSIS — Y929 Unspecified place or not applicable: Secondary | ICD-10-CM | POA: Diagnosis not present

## 2019-07-01 DIAGNOSIS — S0001XA Abrasion of scalp, initial encounter: Secondary | ICD-10-CM | POA: Insufficient documentation

## 2019-07-01 DIAGNOSIS — Y939 Activity, unspecified: Secondary | ICD-10-CM | POA: Diagnosis not present

## 2019-07-01 DIAGNOSIS — T148XXA Other injury of unspecified body region, initial encounter: Secondary | ICD-10-CM

## 2019-07-01 DIAGNOSIS — Z23 Encounter for immunization: Secondary | ICD-10-CM | POA: Insufficient documentation

## 2019-07-01 DIAGNOSIS — R519 Headache, unspecified: Secondary | ICD-10-CM | POA: Diagnosis not present

## 2019-07-01 HISTORY — DX: Calculus of kidney: N20.0

## 2019-07-01 HISTORY — DX: Migraine, unspecified, not intractable, without status migrainosus: G43.909

## 2019-07-01 MED ORDER — HYDROCODONE-ACETAMINOPHEN 5-325 MG PO TABS
1.0000 | ORAL_TABLET | Freq: Once | ORAL | Status: AC
  Administered 2019-07-01: 1 via ORAL

## 2019-07-01 MED ORDER — TETANUS-DIPHTH-ACELL PERTUSSIS 5-2.5-18.5 LF-MCG/0.5 IM SUSP
0.5000 mL | Freq: Once | INTRAMUSCULAR | Status: AC
  Administered 2019-07-01: 01:00:00 0.5 mL via INTRAMUSCULAR
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
## 2019-07-01 NOTE — ED Notes (Signed)
Pt verbalized understanding of DC instructions and follow up care.  

## 2019-07-01 NOTE — Progress Notes (Signed)
Chaplain arrived for this Level I.  Patient was being assessed by medical team.  No needs for spiritual support at this time.  Chaplain available as needed. Chaplain Agustin Cree, [place].     07/01/19 0037  Clinical Encounter Type  Visited With Health care provider  Visit Type Trauma  Referral From Nurse  Consult/Referral To Chaplain
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
## 2021-06-19 ENCOUNTER — Encounter (HOSPITAL_BASED_OUTPATIENT_CLINIC_OR_DEPARTMENT_OTHER): Payer: Self-pay | Admitting: Emergency Medicine

## 2021-06-19 ENCOUNTER — Other Ambulatory Visit: Payer: Self-pay

## 2021-06-19 ENCOUNTER — Emergency Department (HOSPITAL_BASED_OUTPATIENT_CLINIC_OR_DEPARTMENT_OTHER)
Admission: EM | Admit: 2021-06-19 | Discharge: 2021-06-19 | Disposition: A | Discharge status: Home / Self Care | Payer: BC Managed Care – PPO | Attending: Emergency Medicine | Admitting: Emergency Medicine

## 2021-06-19 ENCOUNTER — Emergency Department (HOSPITAL_BASED_OUTPATIENT_CLINIC_OR_DEPARTMENT_OTHER): Payer: BC Managed Care – PPO | Admitting: Radiology

## 2021-06-19 DIAGNOSIS — Z20822 Contact with and (suspected) exposure to covid-19: Secondary | ICD-10-CM | POA: Insufficient documentation

## 2021-06-19 DIAGNOSIS — R0600 Dyspnea, unspecified: Secondary | ICD-10-CM | POA: Diagnosis not present

## 2021-06-19 DIAGNOSIS — R0789 Other chest pain: Secondary | ICD-10-CM | POA: Diagnosis present

## 2021-06-19 LAB — BASIC METABOLIC PANEL
Anion gap: 7 (ref 5–15)
BUN: 14 mg/dL (ref 6–20)
CO2: 29 mmol/L (ref 22–32)
Calcium: 9.6 mg/dL (ref 8.9–10.3)
Chloride: 104 mmol/L (ref 98–111)
Creatinine, Ser: 0.89 mg/dL (ref 0.61–1.24)
GFR, Estimated: >60 mL/min (ref >60)
Glucose, Bld: 102 mg/dL — ABNORMAL HIGH (ref 70–99)
Potassium: 3.7 mmol/L (ref 3.5–5.1)
Sodium: 140 mmol/L (ref 135–145)

## 2021-06-19 LAB — RESP PANEL BY RT-PCR (FLU A&B, COVID) ARPGX2
Influenza A by PCR: NEGATIVE
Influenza B by PCR: NEGATIVE
SARS Coronavirus 2 by RT PCR: NEGATIVE

## 2021-06-19 LAB — CBC
HCT: 44.5 % (ref 39.0–52.0)
Hemoglobin: 14.9 g/dL (ref 13.0–17.0)
MCH: 28 pg (ref 26.0–34.0)
MCHC: 33.5 g/dL (ref 30.0–36.0)
MCV: 83.5 fL (ref 80.0–100.0)
Platelets: 240 10*3/uL (ref 150–400)
RBC: 5.33 MIL/uL (ref 4.22–5.81)
RDW: 12.7 % (ref 11.5–15.5)
WBC: 5.2 10*3/uL (ref 4.0–10.5)
nRBC: 0 % (ref 0.0–0.2)

## 2021-06-19 LAB — D-DIMER, QUANTITATIVE: D-Dimer, Quant: <0.27 ug/mL-FEU (ref 0.00–0.50)

## 2021-06-19 LAB — TROPONIN I (HIGH SENSITIVITY): Troponin I (High Sensitivity): 3 ng/L (ref <18)

## 2021-06-19 MED ORDER — SODIUM CHLORIDE 0.9 % IV BOLUS
1000.0000 mL | Freq: Once | INTRAVENOUS | Status: AC
  Administered 2021-06-19: 1000 mL via INTRAVENOUS

## 2021-06-19 MED ORDER — KETOROLAC TROMETHAMINE 15 MG/ML IJ SOLN
15.0000 mg | Freq: Once | INTRAMUSCULAR | Status: AC
  Administered 2021-06-19: 15 mg via INTRAVENOUS
  Filled 2021-06-19: qty 1

## 2021-06-19 MED ORDER — FAMOTIDINE IN NACL 20-0.9 MG/50ML-% IV SOLN
20.0000 mg | Freq: Once | INTRAVENOUS | Status: AC
  Administered 2021-06-19: 20 mg via INTRAVENOUS
  Filled 2021-06-19: qty 50

## 2021-06-19 MED ORDER — LIDOCAINE 5 % EX PTCH
1.0000 | MEDICATED_PATCH | CUTANEOUS | Status: DC
  Administered 2021-06-19: 1 via TRANSDERMAL
  Filled 2021-06-19: qty 1

## 2021-06-19 MED ORDER — ALUM & MAG HYDROXIDE-SIMETH 200-200-20 MG/5ML PO SUSP
30.0000 mL | Freq: Once | ORAL | Status: AC
  Administered 2021-06-19: 30 mL via ORAL
  Filled 2021-06-19: qty 30

## 2021-06-19 MED ORDER — METHOCARBAMOL 500 MG PO TABS: 1000.0000 mg | ORAL_TABLET | Freq: Two times a day (BID) | ORAL | 0 refills | Status: AC

## 2021-06-19 MED ORDER — LIDOCAINE 5 % EX PTCH: 1.0000 | MEDICATED_PATCH | Freq: Every day | CUTANEOUS | 0 refills | Status: DC | PRN

## 2021-06-19 NOTE — ED Triage Notes (Signed)
Chest pressure, central chest, started few days ago, constant this am.

## 2021-06-19 NOTE — Discharge Instructions (Addendum)
Return to the Emergency Department if you have unusual chest pain, pressure, or discomfort, shortness of breath, nausea, vomiting, burping, heartburn, tingling upper body parts, sweating, cold, clammy skin, or racing heartbeat. Call 911 if you think you are having a heart attack. Take all cardiac medications as prescribed - notify your doctor if you have any side effects. Follow cardiac diet - avoid fatty & fried foods, don't eat too much red meat, eat lots of fruits & vegetables, and dairy products should be low fat. Please lose weight if you are overweight. Become more active with walking, gardening, or any other activity that gets you to moving.   Please return to the emergency department immediately for any new or concerning symptoms, or if you get worse.  It was a pleasure caring for you today in the emergency department.  Please return to the emergency department for any worsening or worrisome symptoms.   

## 2021-06-20 NOTE — ED Provider Notes (Signed)
[place] EMERGENCY DEPT Provider Note   CSN: [id number] Arrival date & time: 06/19/21  1024     History  Chief Complaint  Patient presents with   Chest Pain    Thomas Barrera is a 31 y.o. male.  This is a 31 y.o. male  with significant medical history as below, including kidney stone, migraine who presents to the ED with complaint of cp  Location:  left chest wall Duration:  4-5 days Onset:  sudden Timing:  intermittent Description:  pulling, aching Severity:  mild Exacerbating/Alleviating Factors:  worse with moving arms, palpation Associated Symptoms:  dyspnea Pertinent Negatives:  no fevers or chills, light headedness, diaphoresis nausea or vomiting.  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 He has had some intermittent dyspnea w/ exertion not a/w the chest pain yesterday while exercising/climbing stairs. No dib at this time.     Past Medical History: No date: Kidney calculi No date: Kidney stones No date: Migraine headache No date: Migraines  Past Surgical History: No date: LITHOTRIPSY No date: TONSILLECTOMY AND ADENOIDECTOMY     Comment:  31 yo    The history is provided by the patient and the spouse. No language interpreter was used.  Chest Pain Associated symptoms: no abdominal pain, no cough, no dysphagia, no fever, no headache, no nausea, no palpitations, no shortness of breath and no vomiting       Home Medications Prior to Admission medications   Medication Sig Start Date End Date Taking? Authorizing Provider  lidocaine (LIDODERM) 5 % Place 1 patch onto the skin daily as needed. Remove & Discard patch within 12 hours or as directed by MD 06/19/21  Yes Jeanell Sparrow, DO  methocarbamol (ROBAXIN) 500 MG tablet Take 2 tablets (1,000 mg total) by mouth 2 (two) times daily for 5 days. 06/19/21 06/24/21 Yes Wynona Dove A, DO  albuterol (PROVENTIL  HFA;VENTOLIN HFA) 108 (90 Base) MCG/ACT inhaler Inhale 2 puffs into the lungs every 6 (six) hours as needed for wheezing or shortness of breath. 12/06/15   Orlena Sheldon, PA-C  amoxicillin (AMOXIL) 875 MG tablet Take 1 tablet (875 mg total) by mouth 2 (two) times daily. 12/01/15   Dena Billet B, PA-C  amoxicillin-clavulanate (AUGMENTIN) 875-125 MG tablet Take 1 tablet by mouth every 12 (twelve) hours. 08/08/17   Tasia Catchings, Amy V, PA-C  azithromycin (ZITHROMAX) 250 MG tablet Day 1: Take 2 daily. Days 2-5: Take 1 daily. 12/06/15   Orlena Sheldon, PA-C  benzonatate (TESSALON) 100 MG capsule Take 1 capsule (100 mg total) by mouth every 8 (eight) hours. Patient not taking: Reported on 12/06/2015 11/28/15   Dowless, Aldona Bar Tripp, PA-C  cephALEXin (KEFLEX) 500 MG capsule Take 1 capsule (500 mg total) by mouth 3 (three) times daily. 09/16/17   Domenic Moras, PA-C  dicyclomine (BENTYL) 20 MG tablet Take 1 tablet (20 mg total) by mouth 2 (two) times daily. 04/16/16   Domenic Moras, PA-C  fluticasone (FLONASE) 50 MCG/ACT nasal spray Place 2 sprays into both nostrils daily. 08/08/17   Tasia Catchings, Amy V, PA-C  HYDROcodone-acetaminophen (NORCO/VICODIN) 5-325 MG tablet Take 1 tablet by mouth every 6 (six) hours as needed for moderate pain or severe pain. 09/16/17   Domenic Moras, PA-C  HYDROcodone-homatropine [facility]) 5-1.5 MG/5ML syrup Take 5 mLs by mouth every 8 (eight) hours as needed for cough. Patient not taking: Reported on 12/06/2015 12/01/15   Doren Custard,  Mary B, PA-C  ipratropium (ATROVENT) 0.06 % nasal spray Place 2 sprays into both nostrils 4 (four) times daily. 08/08/17   Tasia Catchings, Amy V, PA-C  rizatriptan (MAXALT-MLT) 10 MG disintegrating tablet Take 1 tablet (10 mg total) by mouth as needed for migraine. May repeat in 2 hours if needed 09/21/15   Susy Frizzle, MD  tamsulosin (FLOMAX) 0.4 MG CAPS capsule Take 1 capsule (0.4 mg total) by mouth daily. 09/16/17   Domenic Moras, PA-C      Allergies    Reglan [metoclopramide] and Reglan  [metoclopramide]    Review of Systems   Review of Systems  Constitutional:  Negative for chills and fever.  HENT:  Negative for facial swelling and trouble swallowing.   Eyes:  Negative for photophobia and visual disturbance.  Respiratory:  Negative for cough and shortness of breath.   Cardiovascular:  Positive for chest pain. Negative for palpitations.  Gastrointestinal:  Negative for abdominal pain, nausea and vomiting.  Endocrine: Negative for polydipsia and polyuria.  Genitourinary:  Negative for difficulty urinating and hematuria.  Musculoskeletal:  Negative for gait problem and joint swelling.  Skin:  Negative for pallor and rash.  Neurological:  Negative for syncope and headaches.  Psychiatric/Behavioral:  Negative for agitation and confusion.    Physical Exam Updated Vital Signs BP (!) 123/93 (BP Location: Right Arm)    Pulse 82    Temp 98.3 F (36.8 C) (Oral)    Resp 16    Ht 5\' 7"  (1.702 m)    Wt 74.8 kg    SpO2 100%    BMI 25.84 kg/m  Physical Exam Vitals and nursing note reviewed.  Constitutional:      General: He is not in acute distress.    Appearance: He is well-developed. He is not ill-appearing or diaphoretic.  HENT:     Head: Normocephalic and atraumatic.     Right Ear: External ear normal.     Left Ear: External ear normal.     Mouth/Throat:     Mouth: Mucous membranes are moist.  Eyes:     General: No scleral icterus.    Extraocular Movements: Extraocular movements intact.     Pupils: Pupils are equal, round, and reactive to light.  Cardiovascular:     Rate and Rhythm: Normal rate and regular rhythm.     Pulses: Normal pulses.          Radial pulses are 2+ on the right side and 2+ on the left side.       Dorsalis pedis pulses are 2+ on the right side and 2+ on the left side.     Heart sounds: Normal heart sounds. No murmur heard. Pulmonary:     Effort: Pulmonary effort is normal. No tachypnea or respiratory distress.     Breath sounds: Normal breath  sounds. No decreased breath sounds.  Chest:     Chest wall: Tenderness present. No mass.     Comments: Mild  tenderness left on palp Abdominal:     General: Abdomen is flat.     Palpations: Abdomen is soft.     Tenderness: There is no abdominal tenderness.  Musculoskeletal:        General: Normal range of motion.     Cervical back: Normal range of motion.     Right lower leg: No edema.     Left lower leg: No edema.  Skin:    General: Skin is warm and dry.     Capillary Refill: Capillary  refill takes less than 2 seconds.  Neurological:     Mental Status: He is alert and oriented to person, place, and time.  Psychiatric:        Mood and Affect: Mood normal.        Behavior: Behavior normal.    ED Results / Procedures / Treatments   Labs (all labs ordered are listed, but only abnormal results are displayed) Labs Reviewed  BASIC METABOLIC PANEL - Abnormal; Notable for the following components:      Result Value   Glucose, Bld 102 (*)    All other components within normal limits  RESP PANEL BY RT-PCR (FLU A&B, COVID) ARPGX2  CBC  D-DIMER, QUANTITATIVE  TROPONIN I (HIGH SENSITIVITY)    EKG EKG Interpretation  Date/Time:  Sunday June 19 2021 10:32:00 EST Ventricular Rate:  79 PR Interval:  161 QRS Duration: 107 QT Interval:  377 QTC Calculation: 433 R Axis:   85 Text Interpretation: Sinus rhythm no prior tracing available to compare Confirmed by Wynona Dove ([id number]) on 06/19/2021 10:37:45 AM  Radiology DG Chest 2 View  Result Date: 06/19/2021 CLINICAL DATA:  31 year old male with history of intermittent left-sided chest pain and shortness of breath for the past 4 days. EXAM: CHEST - 2 VIEW COMPARISON:  No priors. FINDINGS: Lung volumes are normal. No consolidative airspace disease. No pleural effusions. No pneumothorax. No pulmonary nodule or mass noted. Pulmonary vasculature and the cardiomediastinal silhouette are within normal limits. IMPRESSION: No radiographic  evidence of acute cardiopulmonary disease. Electronically Signed   By: Vinnie Langton M.D.   On: 06/19/2021 11:03    Procedures Procedures    Medications Ordered in ED Medications  sodium chloride 0.9 % bolus 1,000 mL (0 mLs Intravenous Stopped 06/19/21 1337)  famotidine (PEPCID) IVPB 20 mg premix (0 mg Intravenous Stopped 06/19/21 1337)  alum & mag hydroxide-simeth (MAALOX/MYLANTA) 200-200-20 MG/5ML suspension 30 mL (30 mLs Oral Given 06/19/21 1139)  ketorolac (TORADOL) 15 MG/ML injection 15 mg (15 mg Intravenous Given 06/19/21 1136)    ED Course/ Medical Decision Making/ A&P                           Medical Decision Making Amount and/or Complexity of Data Reviewed Labs: ordered. Radiology: ordered.  Risk OTC drugs. Prescription drug management.    CC: cp  This patient presents to the Emergency Department for the above complaint. This involves an extensive number of treatment options and is a complaint that carries with it a high risk of complications and morbidity. Vital signs were reviewed. Serious etiologies considered.  Record review:  Previous records obtained and reviewed   Additional history obtained from spouse  Medical and surgical history as noted above.   Work up as above, notable for:  Labs & imaging results that were available during my care of the patient were reviewed by me and considered in my medical decision making.   I ordered imaging studies which included cxr and I independently visualized and interpreted imaging which showed no acute process  Cardiac monitoring reviewed and interpreted personally which shows NSR  Social determinants of health include - N/a  Low risk well's, dimer neg, PE unlikely.  Management: Gi cocktail, analgesics  Reassessment:  Pt reports pain improved, ACS unlikely, favor atypical pain - perhaps MSK vs component of acid reflux.   Discussed care for cp at home, muscle relaxer, apap prn, rest.  The patient's chest  pain is not suggestive of  pulmonary embolus, cardiac ischemia, aortic dissection, pericarditis, myocarditis, pulmonary embolism, pneumothorax, pneumonia, Zoster, or esophageal perforation, or other serious etiology.  Historically not abrupt in onset, tearing or ripping, pulses symmetric. EKG nonspecific for ischemia/infarction. No dysrhythmias, brugada, WPW, prolonged QT noted. Troponin negative. CXR reviewed. Labs without demonstration of acute pathology unless otherwise noted above. Low HEART Score: 0-3 points (0.9-1.7% risk of MACE). Given the extremely low risk of these diagnoses further testing and evaluation for these possibilities does not appear to be indicated at this time. Patient in no distress and overall condition improved here in the ED. Detailed discussions were had with the patient regarding current findings, and need for close f/u with PCP or on call doctor. The patient has been instructed to return immediately if the symptoms worsen in any way for re-evaluation. Patient verbalized understanding and is in agreement with current care plan. All questions answered prior to discharge.   This chart was dictated using voice recognition software.  Despite best efforts to proofread,  errors can occur which can change the documentation meaning.         Final Clinical Impression(s) / ED Diagnoses Final diagnoses:  Atypical chest pain    Rx / DC Orders ED Discharge Orders          Ordered    methocarbamol (ROBAXIN) 500 MG tablet  2 times daily        06/19/21 1424    lidocaine (LIDODERM) 5 %  Daily PRN        06/19/21 1424              Jeanell Sparrow, DO 06/20/21 [phone]

## 2021-08-17 ENCOUNTER — Ambulatory Visit
Admission: EM | Admit: 2021-08-17 | Discharge: 2021-08-17 | Disposition: A | Discharge status: Home / Self Care | Payer: BC Managed Care – PPO | Attending: Family Medicine | Admitting: Family Medicine

## 2021-08-17 ENCOUNTER — Ambulatory Visit (INDEPENDENT_AMBULATORY_CARE_PROVIDER_SITE_OTHER): Payer: BC Managed Care – PPO

## 2021-08-17 DIAGNOSIS — M25562 Pain in left knee: Secondary | ICD-10-CM | POA: Diagnosis not present

## 2021-08-17 MED ORDER — NAPROXEN 500 MG PO TABS: 500.0000 mg | ORAL_TABLET | Freq: Two times a day (BID) | ORAL | 0 refills | Status: DC | PRN

## 2021-08-17 NOTE — ED Triage Notes (Signed)
Pt states his left knee pain started last Thursday while he was at work ? ?Pt states he slipped and caught himself on his left knee ? ?Pt states he has only tried Tylenol for the pain ? ? ?

## 2021-08-17 NOTE — ED Provider Notes (Signed)
?[facility] ? ? ? ?CSN: [id number] ?Arrival date & time: 08/17/21  1144 ? ? ?  ? ?History   ?Chief Complaint ?Chief Complaint  ?Patient presents with  ? Knee Pain  ?  Entered by patient  ? ? ?HPI ?Thomas Barrera is a 31 y.o. male.  ? ?Presenting today with 6-day history of medial left knee pain after slipping and twisting the ankle on the side last week.  States has had some mild swelling in the area and pain mainly with weightbearing motion such as climbing steps.  Denies weakness, numbness, tingling, discoloration, significant swelling, loss of range of motion.  Has been trying Tylenol with minimal relief of the pain.  No past history of orthopedic injuries to the area. ? ?Past Medical History:  ?Diagnosis Date  ? Kidney calculi   ? Kidney stones   ? Migraine headache   ? Migraines   ? ?There are no problems to display for this patient. ? ?Past Surgical History:  ?Procedure Laterality Date  ? LITHOTRIPSY    ? TONSILLECTOMY AND ADENOIDECTOMY    ? 31 yo  ? ? ? ? ? ?Home Medications   ? ?Prior to Admission medications   ?Medication Sig Start Date End Date Taking? Authorizing Provider  ?naproxen (NAPROSYN) 500 MG tablet Take 1 tablet (500 mg total) by mouth 2 (two) times daily as needed. 08/17/21  Yes Particia Nearing, PA-C  ?albuterol (PROVENTIL HFA;VENTOLIN HFA) 108 (90 Base) MCG/ACT inhaler Inhale 2 puffs into the lungs every 6 (six) hours as needed for wheezing or shortness of breath. 12/06/15   Dorena Bodo, PA-C  ?amoxicillin (AMOXIL) 875 MG tablet Take 1 tablet (875 mg total) by mouth 2 (two) times daily. 12/01/15   Dorena Bodo, PA-C  ?amoxicillin-clavulanate (AUGMENTIN) 875-125 MG tablet Take 1 tablet by mouth every 12 (twelve) hours. 08/08/17   Cathie Hoops, Amy V, PA-C  ?azithromycin (ZITHROMAX) 250 MG tablet Day 1: Take 2 daily. Days 2-5: Take 1 daily. 12/06/15   Dorena Bodo, PA-C  ?benzonatate (TESSALON) 100 MG capsule Take 1 capsule (100 mg total) by mouth every 8 (eight) hours. ?Patient not taking:  Reported on 12/06/2015 11/28/15   Dowless, Lester Kinsman, PA-C  ?cephALEXin (KEFLEX) 500 MG capsule Take 1 capsule (500 mg total) by mouth 3 (three) times daily. 09/16/17   Fayrene Helper, PA-C  ?dicyclomine (BENTYL) 20 MG tablet Take 1 tablet (20 mg total) by mouth 2 (two) times daily. 04/16/16   Fayrene Helper, PA-C  ?fluticasone (FLONASE) 50 MCG/ACT nasal spray Place 2 sprays into both nostrils daily. 08/08/17   Cathie Hoops, Amy V, PA-C  ?HYDROcodone-acetaminophen (NORCO/VICODIN) 5-325 MG tablet Take 1 tablet by mouth every 6 (six) hours as needed for moderate pain or severe pain. 09/16/17   Fayrene Helper, PA-C  ?HYDROcodone-homatropine [facility]) 5-1.5 MG/5ML syrup Take 5 mLs by mouth every 8 (eight) hours as needed for cough. ?Patient not taking: Reported on 12/06/2015 12/01/15   Allayne Butcher B, PA-C  ?ipratropium (ATROVENT) 0.06 % nasal spray Place 2 sprays into both nostrils 4 (four) times daily. 08/08/17   Cathie Hoops, Amy V, PA-C  ?lidocaine (LIDODERM) 5 % Place 1 patch onto the skin daily as needed. Remove & Discard patch within 12 hours or as directed by MD 06/19/21   Tanda Rockers A, DO  ?rizatriptan (MAXALT-MLT) 10 MG disintegrating tablet Take 1 tablet (10 mg total) by mouth as needed for migraine. May repeat in 2 hours if needed 09/21/15   Donita Brooks, MD  ?  tamsulosin (FLOMAX) 0.4 MG CAPS capsule Take 1 capsule (0.4 mg total) by mouth daily. 09/16/17   Fayrene Helper, PA-C  ? ? ?Family History ?Family History  ?Problem Relation Age of Onset  ? Arthritis Mother   ? Arthritis Father   ? Arthritis Maternal Grandmother   ? Breast cancer Maternal Grandmother   ? Arthritis Maternal Grandfather   ? Hyperlipidemia Maternal Grandfather   ? Arthritis Paternal Grandmother   ? Arthritis Paternal Grandfather   ? ? ?Social History ?Social History  ? ?Tobacco Use  ? Smoking status: Never  ? Smokeless tobacco: Current  ?  Types: Chew, Snuff  ?Vaping Use  ? Vaping Use: Never used  ?Substance Use Topics  ? Alcohol use: Yes  ? Drug use: Not Currently   ? ? ? ?Allergies   ?Reglan [metoclopramide] and Reglan [metoclopramide] ? ? ?Review of Systems ?Review of Systems ?Per HPI ? ?Physical Exam ?Triage Vital Signs ?ED Triage Vitals  ?Enc Vitals Group  ?   BP 08/17/21 1226 112/74  ?   Pulse Rate 08/17/21 1226 78  ?   Resp 08/17/21 1226 18  ?   Temp 08/17/21 1226 98.9 ?F (37.2 ?C)  ?   Temp Source 08/17/21 1226 Oral  ?   SpO2 08/17/21 1226 97 %  ?   Weight --   ?   Height --   ?   Head Circumference --   ?   Peak Flow --   ?   Pain Score 08/17/21 1227 5  ?   Pain Loc --   ?   Pain Edu? --   ?   Excl. in GC? --   ? ?No data found. ? ?Updated Vital Signs ?BP 112/74 (BP Location: Right Arm)   Pulse 78   Temp 98.9 ?F (37.2 ?C) (Oral)   Resp 18   SpO2 97%  ? ?Visual Acuity ?Right Eye Distance:   ?Left Eye Distance:   ?Bilateral Distance:   ? ?Right Eye Near:   ?Left Eye Near:    ?Bilateral Near:    ? ?Physical Exam ?Vitals and nursing note reviewed.  ?Constitutional:   ?   Appearance: Normal appearance.  ?HENT:  ?   Head: Atraumatic.  ?Eyes:  ?   Extraocular Movements: Extraocular movements intact.  ?   Conjunctiva/sclera: Conjunctivae normal.  ?Cardiovascular:  ?   Rate and Rhythm: Normal rate and regular rhythm.  ?Pulmonary:  ?   Effort: Pulmonary effort is normal.  ?   Breath sounds: Normal breath sounds.  ?Musculoskeletal:     ?   General: Tenderness and signs of injury present. No swelling or deformity. Normal range of motion.  ?   Cervical back: Normal range of motion and neck supple.  ?   Comments: Minimal tenderness to palpation left medial patella.  Negative drawer testing, no joint instability on exam, negative McMurray's, normal gait and range of motion both passive and active  ?Skin: ?   General: Skin is warm and dry.  ?   Findings: No bruising or erythema.  ?Neurological:  ?   General: No focal deficit present.  ?   Mental Status: He is oriented to person, place, and time.  ?Psychiatric:     ?   Mood and Affect: Mood normal.     ?   Thought Content: Thought  content normal.     ?   Judgment: Judgment normal.  ? ? ? ?UC Treatments / Results  ?Labs ?(all labs ordered are  listed, but only abnormal results are displayed) ?Labs Reviewed - No data to display ? ?EKG ? ? ?Radiology ?DG Knee Complete 4 Views Left ? ?Result Date: 08/17/2021 ?CLINICAL DATA:  Trauma, pain EXAM: LEFT KNEE - COMPLETE 4+ VIEW COMPARISON:  None. FINDINGS: No evidence of fracture, dislocation, or joint effusion. No evidence of arthropathy or other focal bone abnormality. Soft tissues are unremarkable. IMPRESSION: No fracture or dislocation is seen. Electronically Signed   By: Ernie AvenaPalani  Rathinasamy M.D.   On: 08/17/2021 12:45   ? ?Procedures ?Procedures (including critical care time) ? ?Medications Ordered in UC ?Medications - No data to display ? ?Initial Impression / Assessment and Plan / UC Course  ?I have reviewed the triage vital signs and the nursing notes. ? ?Pertinent labs & imaging results that were available during my care of the patient were reviewed by me and considered in my medical decision making (see chart for details). ? ?  ? ?X-ray of the left knee negative for acute bony abnormality and exam very reassuring.  Suspect strain of the knee, treat with naproxen, RICE protocol.  Return for any acutely worsening symptoms. ? ?Final Clinical Impressions(s) / UC Diagnoses  ? ?Final diagnoses:  ?Acute pain of left knee  ? ?Discharge Instructions   ?None ?  ? ?ED Prescriptions   ? ? Medication Sig Dispense Auth. Provider  ? naproxen (NAPROSYN) 500 MG tablet Take 1 tablet (500 mg total) by mouth 2 (two) times daily as needed. 30 tablet Particia NearingLane, Siona Coulston [place], [place]PA-C  ? ?  ? ?PDMP not reviewed this encounter. ?  ?Particia NearingLane, Joane Postel Elizabeth, PA-C ?08/17/21 1328 ? ?

## 2022-09-07 IMAGING — DX DG KNEE COMPLETE 4+V*L*
4 series · 4 of 4 positions shown · non-contrast
Comparison: None.

CLINICAL DATA: Trauma, pain

EXAM:
LEFT KNEE - COMPLETE 4+ VIEW

[knee ap]
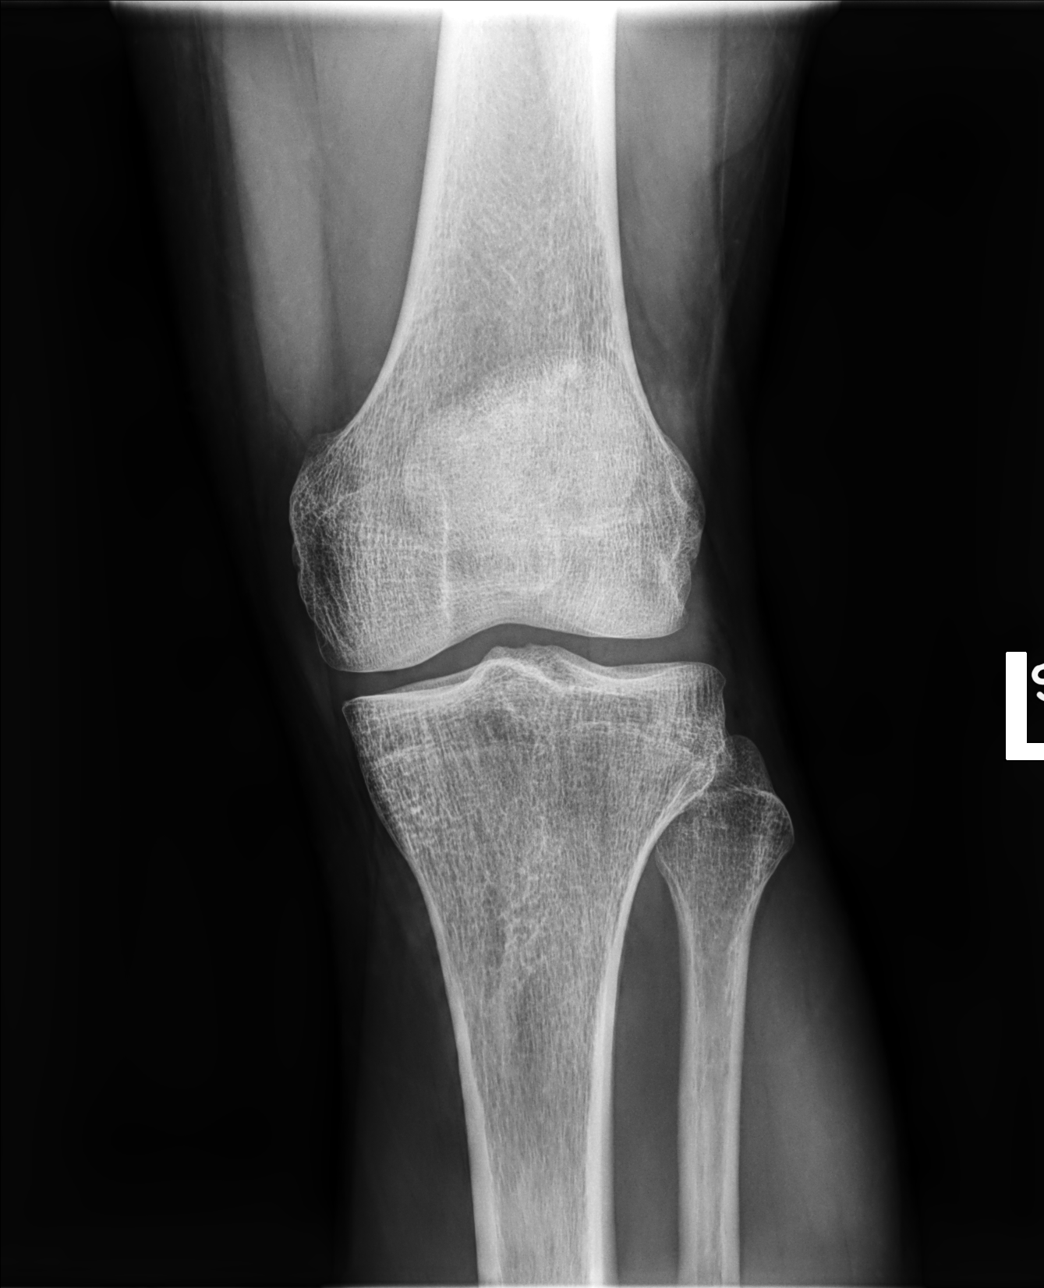

[knee mlo (1 of 2)]
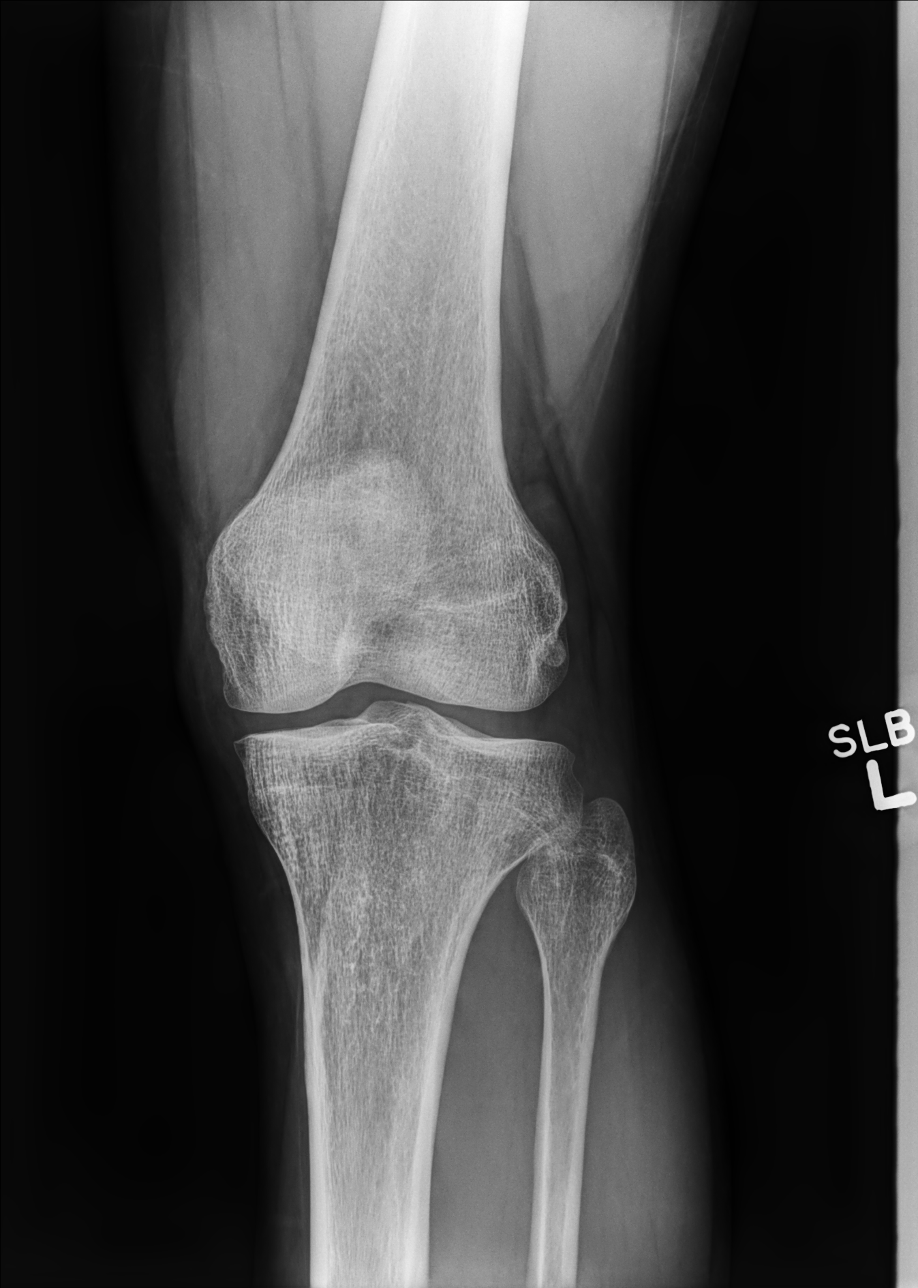

[knee mlo (2 of 2)]
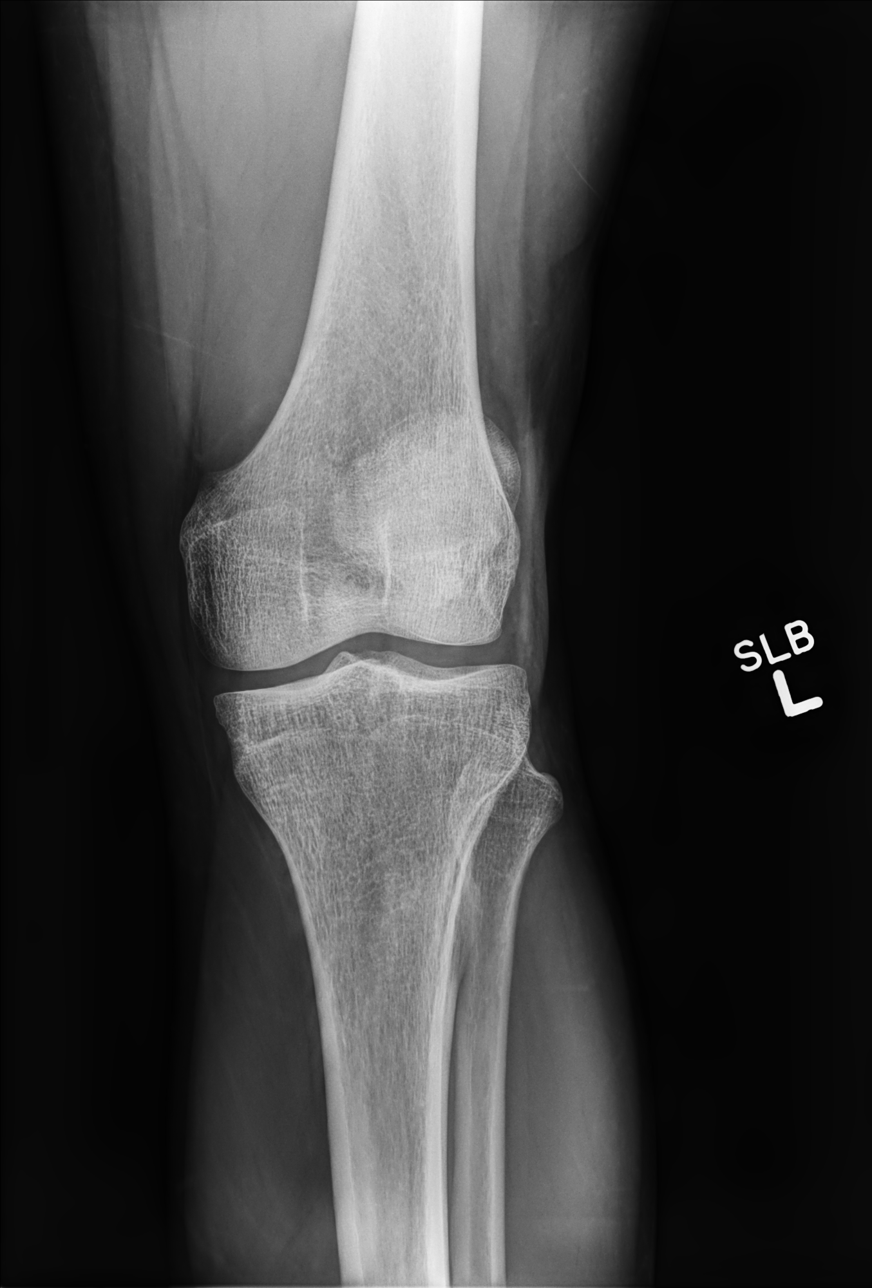

[knee lat]
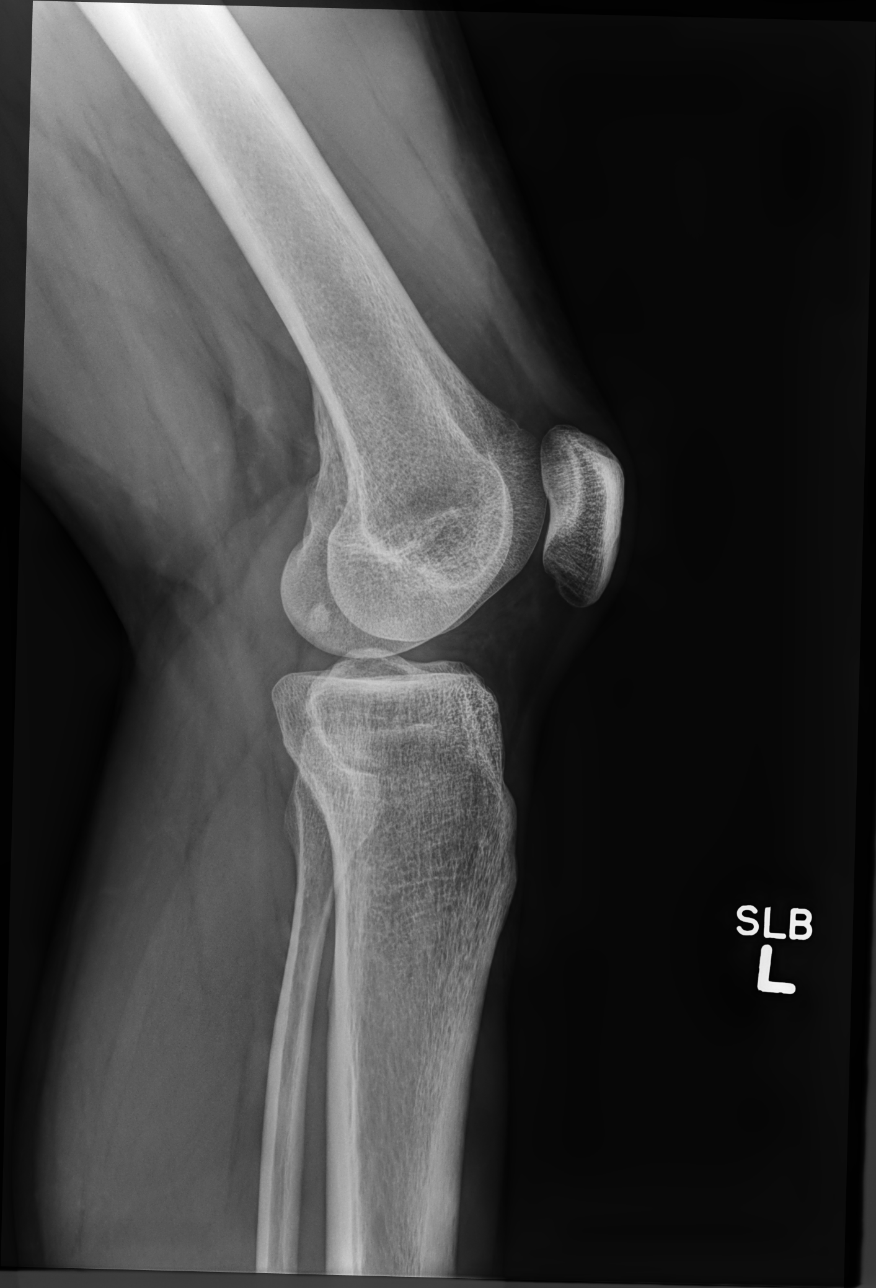

[4 of 4 positions shown; findings below may reference images not displayed]

FINDINGS: No evidence of fracture, dislocation, or joint effusion. No evidence
of arthropathy or other focal bone abnormality. Soft tissues are
unremarkable.
IMPRESSION: No fracture or dislocation is seen.

## 2023-06-03 ENCOUNTER — Emergency Department
Admission: EM | Admit: 2023-06-03 | Discharge: 2023-06-03 | Disposition: A | Discharge status: Home / Self Care | Payer: Worker's Compensation | Attending: Emergency Medicine | Admitting: Emergency Medicine

## 2023-06-03 ENCOUNTER — Other Ambulatory Visit: Payer: Self-pay

## 2023-06-03 DIAGNOSIS — Z7721 Contact with and (suspected) exposure to potentially hazardous body fluids: Secondary | ICD-10-CM | POA: Insufficient documentation

## 2023-06-03 LAB — COMPREHENSIVE METABOLIC PANEL
ALT: 21 U/L (ref 0–44)
AST: 29 U/L (ref 15–41)
Albumin: 4.7 g/dL (ref 3.5–5.0)
Alkaline Phosphatase: 110 U/L (ref 38–126)
Anion gap: 13 (ref 5–15)
BUN: 12 mg/dL (ref 6–20)
CO2: 22 mmol/L (ref 22–32)
Calcium: 9.2 mg/dL (ref 8.9–10.3)
Chloride: 102 mmol/L (ref 98–111)
Creatinine, Ser: 0.91 mg/dL (ref 0.61–1.24)
GFR, Estimated: >60 mL/min (ref >60)
Glucose, Bld: 113 mg/dL — ABNORMAL HIGH (ref 70–99)
Potassium: 3.6 mmol/L (ref 3.5–5.1)
Sodium: 137 mmol/L (ref 135–145)
Total Bilirubin: 1.2 mg/dL (ref 0.0–1.2)
Total Protein: 7.6 g/dL (ref 6.5–8.1)

## 2023-06-03 LAB — RAPID HIV SCREEN (HIV 1/2 AB+AG)
HIV 1/2 Antibodies: NONREACTIVE
HIV-1 P24 Antigen - HIV24: NONREACTIVE

## 2023-06-03 MED ORDER — BICTEGRAVIR-EMTRICITAB-TENOFOV 50-200-25 MG PO PREPACK
1.0000 | ORAL_TABLET | Freq: Once | ORAL | Status: DC
  Filled 2023-06-03 (×3): qty 1

## 2023-06-03 MED ORDER — BICTEGRAVIR-EMTRICITAB-TENOFOV 50-200-25 MG PO TABS
1.0000 | ORAL_TABLET | Freq: Every day | ORAL | Status: DC
  Administered 2023-06-03: 1 via ORAL
  Filled 2023-06-03: qty 1

## 2023-06-03 MED ORDER — BICTEGRAVIR-EMTRICITAB-TENOFOV 50-200-25 MG PO TABS: 1.0000 | ORAL_TABLET | Freq: Every day | ORAL | 0 refills | Status: AC
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
## 2023-06-03 NOTE — Discharge Instructions (Addendum)
 Please follow-up with your primary care

## 2023-06-04 ENCOUNTER — Telehealth: Payer: Self-pay | Admitting: Surgery

## 2023-06-04 LAB — HEPATITIS C ANTIBODY: HCV Ab: NONREACTIVE

## 2023-06-04 LAB — HEPATITIS B SURFACE ANTIGEN: Hepatitis B Surface Ag: NONREACTIVE

## 2023-06-04 NOTE — Telephone Encounter (Signed)
 [facility] ED RNCM attempted to reach patient tonight, no answer left message for return call to follow up to assist with obtaining his discharge medication and follow up for lab draw.  Will reach out to patient tomorrow evening as well.

## 2024-01-30 ENCOUNTER — Ambulatory Visit: Admission: EM | Admit: 2024-01-30 | Discharge: 2024-01-30 | Disposition: A | Discharge status: Home / Self Care

## 2024-01-30 DIAGNOSIS — R030 Elevated blood-pressure reading, without diagnosis of hypertension: Secondary | ICD-10-CM

## 2024-01-30 DIAGNOSIS — G43909 Migraine, unspecified, not intractable, without status migrainosus: Secondary | ICD-10-CM | POA: Diagnosis not present

## 2024-01-30 MED ORDER — ONDANSETRON 4 MG PO TBDP: 4.0000 mg | ORAL_TABLET | Freq: Three times a day (TID) | ORAL | 0 refills | Status: DC | PRN

## 2024-01-30 MED ORDER — ONDANSETRON 4 MG PO TBDP
4.0000 mg | ORAL_TABLET | Freq: Once | ORAL | Status: AC
  Administered 2024-01-30: 4 mg via ORAL

## 2024-01-30 MED ORDER — KETOROLAC TROMETHAMINE 30 MG/ML IJ SOLN
30.0000 mg | Freq: Once | INTRAMUSCULAR | Status: AC
  Administered 2024-01-30: 30 mg via INTRAMUSCULAR

## 2024-01-30 NOTE — ED Provider Notes (Signed)
 Thomas Barrera    CSN: [id number] Arrival date & time: 01/30/24  9175      History   Chief Complaint Chief Complaint  Patient presents with   Migraine    HPI Thomas Barrera is a 33 y.o. male.  Patient presents with 1 day history of headache.  He has a history of migraine headaches and states this is his typical migraine but lasting longer than usual.  He believes he waited too long to take OTC medication for his headache.  He took Tylenol  last night at 2200 and 1 tablet of naproxen  at 0200 this morning.  He has associated nausea.  No head trauma.  This is not the worst headache of his life.  No chest pain, shortness of breath, numbness, weakness, vision changes, dizziness.  The history is provided by the patient and medical records.    Past Medical History:  Diagnosis Date   Kidney calculi    Kidney stones    Migraine headache    Migraines     There are no active problems to display for this patient.   Past Surgical History:  Procedure Laterality Date   LITHOTRIPSY     TONSILLECTOMY AND ADENOIDECTOMY     33 yo       Home Medications    Prior to Admission medications   Medication Sig Start Date End Date Taking? Authorizing Provider  naproxen  (NAPROSYN ) 500 MG tablet Take 1 tablet (500 mg total) by mouth 2 (two) times daily as needed. 08/17/21  Yes Stuart Vernell Norris, PA-C  ondansetron  (ZOFRAN -ODT) 4 MG disintegrating tablet Take 1 tablet (4 mg total) by mouth every 8 (eight) hours as needed for nausea or vomiting. 01/30/24  Yes Corlis Burnard DEL, NP  testosterone cypionate (DEPOTESTOSTERONE CYPIONATE) 200 MG/ML injection Inject 200 mg into the muscle every 14 (fourteen) days. Weekly 0.5   Yes [provider]  albuterol  (PROVENTIL  HFA;VENTOLIN  HFA) 108 (90 Base) MCG/ACT inhaler Inhale 2 puffs into the lungs every 6 (six) hours as needed for wheezing or shortness of breath. 12/06/15   Melvenia Ronal NOVAK, PA-C  amoxicillin  (AMOXIL ) 875 MG tablet Take 1 tablet (875  mg total) by mouth 2 (two) times daily. 12/01/15   Melvenia Ronal NOVAK, PA-C  amoxicillin -clavulanate (AUGMENTIN ) 875-125 MG tablet Take 1 tablet by mouth every 12 (twelve) hours. 08/08/17   Babara, Amy V, PA-C  azithromycin  (ZITHROMAX ) 250 MG tablet Day 1: Take 2 daily. Days 2-5: Take 1 daily. 12/06/15   Melvenia Ronal NOVAK, PA-C  benzonatate  (TESSALON ) 100 MG capsule Take 1 capsule (100 mg total) by mouth every 8 (eight) hours. Patient not taking: Reported on 12/06/2015 11/28/15   Dowless, Samantha Tripp, PA-C  cephALEXin  (KEFLEX ) 500 MG capsule Take 1 capsule (500 mg total) by mouth 3 (three) times daily. 09/16/17   Nivia Colon, PA-C  dicyclomine  (BENTYL ) 20 MG tablet Take 1 tablet (20 mg total) by mouth 2 (two) times daily. 04/16/16   Nivia Colon, PA-C  fluticasone  (FLONASE ) 50 MCG/ACT nasal spray Place 2 sprays into both nostrils daily. 08/08/17   Babara, Amy V, PA-C  HYDROcodone -acetaminophen  (NORCO/VICODIN) 5-325 MG tablet Take 1 tablet by mouth every 6 (six) hours as needed for moderate pain or severe pain. 09/16/17   Nivia Colon, PA-C  HYDROcodone -homatropine (HYCODAN) 5-1.5 MG/5ML syrup Take 5 mLs by mouth every 8 (eight) hours as needed for cough. Patient not taking: Reported on 12/06/2015 12/01/15   Melvenia Ronal B, PA-C  ipratropium (ATROVENT ) 0.06 % nasal spray Place 2  sprays into both nostrils 4 (four) times daily. 08/08/17   Babara, Amy V, PA-C  lidocaine  (LIDODERM ) 5 % Place 1 patch onto the skin daily as needed. Remove & Discard patch within 12 hours or as directed by MD 06/19/21   Elnor Savant A, DO  rizatriptan  (MAXALT -MLT) 10 MG disintegrating tablet Take 1 tablet (10 mg total) by mouth as needed for migraine. May repeat in 2 hours if needed 09/21/15   Duanne Butler DASEN, MD  tamsulosin  (FLOMAX ) 0.4 MG CAPS capsule Take 1 capsule (0.4 mg total) by mouth daily. 09/16/17   Nivia Colon, PA-C    Family History Family History  Problem Relation Age of Onset   Arthritis Mother    Arthritis Father    Arthritis Maternal  Grandmother    Breast cancer Maternal Grandmother    Arthritis Maternal Grandfather    Hyperlipidemia Maternal Grandfather    Arthritis Paternal Grandmother    Arthritis Paternal Grandfather     Social History Social History   Tobacco Use   Smoking status: Never   Smokeless tobacco: Current    Types: Chew, Snuff  Vaping Use   Vaping status: Never Used  Substance Use Topics   Alcohol use: Yes    Comment: occ   Drug use: Not Currently     Allergies   Reglan  [metoclopramide ] and Reglan  [metoclopramide ]   Review of Systems Review of Systems  Constitutional:  Negative for chills and fever.  HENT:  Negative for ear pain and sore throat.   Eyes:  Negative for visual disturbance.  Respiratory:  Negative for cough and shortness of breath.   Cardiovascular:  Negative for chest pain and palpitations.  Gastrointestinal:  Positive for nausea. Negative for diarrhea and vomiting.  Neurological:  Positive for headaches. Negative for dizziness, syncope, weakness, light-headedness and numbness.     Physical Exam Triage Vital Signs ED Triage Vitals  Encounter Vitals Group     BP 01/30/24 0904 (!) 150/94     Girls Systolic BP Percentile --      Girls Diastolic BP Percentile --      Boys Systolic BP Percentile --      Boys Diastolic BP Percentile --      Pulse Rate 01/30/24 0904 73     Resp 01/30/24 0904 18     Temp 01/30/24 0904 97.6 F (36.4 C)     Temp Source 01/30/24 0904 Oral     SpO2 01/30/24 0904 98 %     Weight --      Height --      Head Circumference --      Peak Flow --      Pain Score 01/30/24 0857 5     Pain Loc --      Pain Education --      Exclude from Growth Chart --    No data found.  Updated Vital Signs BP (!) 150/94   Pulse 73   Temp 97.6 F (36.4 C) (Oral)   Resp 18   SpO2 98%   Visual Acuity Right Eye Distance:   Left Eye Distance:   Bilateral Distance:    Right Eye Near:   Left Eye Near:    Bilateral Near:     Physical  Exam Constitutional:      General: He is not in acute distress. HENT:     Right Ear: Tympanic membrane normal.     Left Ear: Tympanic membrane normal.     Nose: Nose normal.  Mouth/Throat:     Mouth: Mucous membranes are moist.     Pharynx: Oropharynx is clear.  Eyes:     Pupils: Pupils are equal, round, and reactive to light.  Cardiovascular:     Rate and Rhythm: Normal rate and regular rhythm.     Heart sounds: Normal heart sounds.  Pulmonary:     Effort: Pulmonary effort is normal. No respiratory distress.     Breath sounds: Normal breath sounds.  Neurological:     General: No focal deficit present.     Mental Status: He is alert and oriented to person, place, and time.     Cranial Nerves: No cranial nerve deficit.     Sensory: No sensory deficit.     Motor: No weakness.     Gait: Gait normal.      UC Treatments / Results  Labs (all labs ordered are listed, but only abnormal results are displayed) Labs Reviewed - No data to display  EKG   Radiology No results found.  Procedures Procedures (including critical care time)  Medications Ordered in UC Medications  ondansetron  (ZOFRAN -ODT) disintegrating tablet 4 mg (has no administration in time range)  ketorolac  (TORADOL ) 30 MG/ML injection 30 mg (has no administration in time range)    Initial Impression / Assessment and Plan / UC Course  I have reviewed the triage vital signs and the nursing notes.  Pertinent labs & imaging results that were available during my care of the patient were reviewed by me and considered in my medical decision making (see chart for details).    Migraine headache, elevated blood pressure reading.  Patient has history of migraines.  Toradol  and Zofran  given here as patient reports this has worked well for him in the past.  Prescription for Zofran  sent to pharmacy.  Education provided on migraine headache.  ED precautions given.  Also discussed with patient that his blood pressure is  elevated today and needs to be rechecked by his PCP.  He agrees to plan of care.  Final Clinical Impressions(s) / UC Diagnoses   Final diagnoses:  Migraine without status migrainosus, not intractable, unspecified migraine type  Elevated blood pressure reading     Discharge Instructions      You were given an injection of Toradol  and a dose of Zofran  today for your headache.  Do not take any over-the-counter medications today.    A prescription for Zofran  was sent to your pharmacy.    Your blood pressure is elevated today at 150/94.  Please have this rechecked by your primary care provider.      Follow up with your primary care provider tomorrow.  Go to the emergency department if you have worsening symptoms.        ED Prescriptions     Medication Sig Dispense Auth. Provider   ondansetron  (ZOFRAN -ODT) 4 MG disintegrating tablet Take 1 tablet (4 mg total) by mouth every 8 (eight) hours as needed for nausea or vomiting. 20 tablet Corlis Burnard DEL, NP      PDMP not reviewed this encounter.   Corlis Burnard DEL, NP 01/30/24 [phone]

## 2024-01-30 NOTE — ED Triage Notes (Signed)
 Pt reports terrible migraine starting yesterday. Has h/o migraines but they have improved over last few years. This is worst. Over entire head. Took Tylenol  before bed last night and woke up out of sleep. Vomited three times  and took hot showers multiple times through night. Took Naproxen  at approx 0200 for a small amount of relief. No vision changes, just mild sensitivity to light.   (Allergic to Reglan )

## 2024-01-30 NOTE — Discharge Instructions (Addendum)
 You were given an injection of Toradol  and a dose of Zofran  today for your headache.  Do not take any over-the-counter medications today.    A prescription for Zofran  was sent to your pharmacy.    Your blood pressure is elevated today at 150/94.  Please have this rechecked by your primary care provider.      Follow up with your primary care provider tomorrow.  Go to the emergency department if you have worsening symptoms.

## 2024-05-06 ENCOUNTER — Other Ambulatory Visit: Payer: Self-pay

## 2024-05-06 ENCOUNTER — Emergency Department (HOSPITAL_BASED_OUTPATIENT_CLINIC_OR_DEPARTMENT_OTHER)
Admission: EM | Admit: 2024-05-06 | Discharge: 2024-05-06 | Disposition: A | Discharge status: Home / Self Care | Attending: Emergency Medicine | Admitting: Emergency Medicine

## 2024-05-06 ENCOUNTER — Encounter (HOSPITAL_BASED_OUTPATIENT_CLINIC_OR_DEPARTMENT_OTHER): Payer: Self-pay

## 2024-05-06 ENCOUNTER — Emergency Department (HOSPITAL_BASED_OUTPATIENT_CLINIC_OR_DEPARTMENT_OTHER): Admitting: Radiology

## 2024-05-06 DIAGNOSIS — I10 Essential (primary) hypertension: Secondary | ICD-10-CM | POA: Diagnosis not present

## 2024-05-06 DIAGNOSIS — Z79899 Other long term (current) drug therapy: Secondary | ICD-10-CM | POA: Diagnosis not present

## 2024-05-06 DIAGNOSIS — R0789 Other chest pain: Secondary | ICD-10-CM | POA: Diagnosis present

## 2024-05-06 DIAGNOSIS — R072 Precordial pain: Secondary | ICD-10-CM

## 2024-05-06 LAB — CBC
HCT: 46.9 % (ref 39.0–52.0)
Hemoglobin: 16.1 g/dL (ref 13.0–17.0)
MCH: 28.9 pg (ref 26.0–34.0)
MCHC: 34.3 g/dL (ref 30.0–36.0)
MCV: 84.1 fL (ref 80.0–100.0)
Platelets: 258 K/uL (ref 150–400)
RBC: 5.58 MIL/uL (ref 4.22–5.81)
RDW: 12.9 % (ref 11.5–15.5)
WBC: 8 K/uL (ref 4.0–10.5)
nRBC: 0 % (ref 0.0–0.2)

## 2024-05-06 LAB — BASIC METABOLIC PANEL WITH GFR
Anion gap: 10 (ref 5–15)
BUN: 13 mg/dL (ref 6–20)
CO2: 28 mmol/L (ref 22–32)
Calcium: 9 mg/dL (ref 8.9–10.3)
Chloride: 101 mmol/L (ref 98–111)
Creatinine, Ser: 1.13 mg/dL (ref 0.61–1.24)
GFR, Estimated: >60 mL/min (ref >60)
Glucose, Bld: 109 mg/dL — ABNORMAL HIGH (ref 70–99)
Potassium: 3.6 mmol/L (ref 3.5–5.1)
Sodium: 138 mmol/L (ref 135–145)

## 2024-05-06 LAB — TROPONIN T, HIGH SENSITIVITY
Troponin T High Sensitivity: <15 ng/L (ref 0–19)
Troponin T High Sensitivity: <15 ng/L (ref 0–19)

## 2024-05-06 MED ORDER — HYDROCHLOROTHIAZIDE 12.5 MG PO TABS: 12.5000 mg | ORAL_TABLET | Freq: Every day | ORAL | 0 refills | Status: AC

## 2024-05-06 NOTE — ED Triage Notes (Signed)
 Pt POV from home c/o intermittent chest pain, sharp in nature from left chest radiating towards left axillary, x approx 8 days. Pt states for the last 2 days it has felt like someone sitting on his chest. Denies SOB. NAD during triage

## 2024-05-06 NOTE — Discharge Instructions (Signed)

## 2024-05-06 NOTE — ED Provider Notes (Signed)
 [name] Provider Note   CSN: [id number] Arrival date & time: 05/06/24  9781     Patient presents with: Chest Pain   Thomas Barrera is a 33 y.o. male.   The history is provided by the patient.  Patient with history of migraines, hypogonadism and erythrocytosis presents with chest pressure Patient reports around a week ago he started feeling fatigue and generalized weakness and pain in his left arm.  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  He went in for phlebotomy for his erythrocytosis later in the week and felt well.  Over the past 2 days however he has been having increasing fatigue and chest pressure.  No fevers or vomiting.  He reports mild shortness of breath.  No syncope.  No diaphoresis.  He is able to do his usual routine without difficulty. No pleuritic pain.  No ripping or tearing sensation in his back.  No arm or leg weakness. No history of CAD/VTE.  He is a non-smoker.  He uses smokeless tobacco. No family history of premature CAD No recent travel or surgery  He has noted his blood pressure has been more elevated which is unusual Past Medical History:  Diagnosis Date   Kidney calculi    Kidney stones    Migraine headache    Migraines     Prior to Admission medications  Medication Sig Start Date End Date Taking? Authorizing Provider  hydrochlorothiazide  (HYDRODIURIL ) 12.5 MG tablet Take 1 tablet (12.5 mg total) by mouth daily. 05/06/24  Yes Midge Golas, MD  dicyclomine  (BENTYL ) 20 MG tablet Take 1 tablet (20 mg total) by mouth 2 (two) times daily. 04/16/16   Nivia Colon, PA-C  fluticasone  (FLONASE ) 50 MCG/ACT nasal spray Place 2 sprays into both nostrils daily. 08/08/17   Babara, Amy V, PA-C  ipratropium (ATROVENT ) 0.06 % nasal spray Place 2 sprays into both nostrils 4 (four) times daily. 08/08/17   Babara, Amy V, PA-C  rizatriptan  (MAXALT -MLT) 10 MG disintegrating tablet Take 1 tablet (10 mg total) by mouth as needed  for migraine. May repeat in 2 hours if needed 09/21/15   Duanne Butler DASEN, MD  testosterone cypionate (DEPOTESTOSTERONE CYPIONATE) 200 MG/ML injection Inject 200 mg into the muscle every 14 (fourteen) days. Weekly 0.5    [provider]    Allergies: Reglan  [metoclopramide ] and Reglan  [metoclopramide ]    Review of Systems  Constitutional:  Positive for fatigue. Negative for fever.  Cardiovascular:  Positive for chest pain. Negative for leg swelling.  Gastrointestinal:  Negative for vomiting.  Neurological:  Negative for weakness and numbness.    Updated Vital Signs BP (!) 133/94   Pulse 69   Temp 98.5 F (36.9 C) (Oral)   Resp 19   Ht 1.702 m (5' 7)   Wt 74.8 kg   SpO2 100%   BMI 25.84 kg/m   Physical Exam CONSTITUTIONAL: Well developed/well nourished HEAD: Normocephalic/atraumatic EYES: EOMI/PERRL ENMT: Mucous membranes moist NECK: supple no meningeal signs SPINE/BACK:entire spine nontender CV: S1/S2 noted, no murmurs/rubs/gallops noted LUNGS: Lungs are clear to auscultation bilaterally, no apparent distress ABDOMEN: soft, nontender, no rebound or guarding, bowel sounds noted throughout abdomen GU:no cva tenderness NEURO: Pt is awake/alert/appropriate, moves all extremitiesx4.  No facial droop.   EXTREMITIES: pulses normal/equalx4, full ROM No lower extremity edema SKIN: warm, color normal PSYCH: no abnormalities of mood noted, alert and oriented to situation  (all labs ordered are listed, but only abnormal results are displayed) Labs Reviewed  BASIC METABOLIC PANEL WITH GFR - Abnormal; Notable for the following components:      Result Value   Glucose, Bld 109 (*)    All other components within normal limits  CBC  TROPONIN T, HIGH SENSITIVITY  TROPONIN T, HIGH SENSITIVITY    EKG: EKG Interpretation Date/Time:  Tuesday May 06 2024 02:34:28 EST Ventricular Rate:  80 PR Interval:  163 QRS Duration:  101 QT Interval:  347 QTC Calculation: 401 R  Axis:   73  Text Interpretation: Sinus rhythm No significant change since last tracing Confirmed by Midge Golas ([id number]) on 05/06/2024 3:01:18 AM  Radiology: DG Chest 2 View Result Date: 05/06/2024 EXAM: 2 VIEW(S) XRAY OF THE CHEST 05/06/2024 03:03:31 AM COMPARISON: 06/19/2021 CLINICAL HISTORY: Chest pain. FINDINGS: LUNGS AND PLEURA: No focal pulmonary opacity. No pleural effusion. No pneumothorax. HEART AND MEDIASTINUM: No acute abnormality of the cardiac and mediastinal silhouettes. BONES AND SOFT TISSUES: No acute osseous abnormality. IMPRESSION: 1. No acute cardiopulmonary process identified. Electronically signed by: Oneil Devonshire MD 05/06/2024 03:14 AM EST RP Workstation: [id number]     Procedures   Medications Ordered in the ED - No data to display              HEART Score: 1                    Medical Decision Making Amount and/or Complexity of Data Reviewed Labs: ordered. Radiology: ordered.  Risk Prescription drug management.   This patient presents to the ED for concern of chest pain, this involves an extensive number of treatment options, and is a complaint that carries with it a high risk of complications and morbidity.  The differential diagnosis includes but is not limited to acute coronary syndrome, aortic dissection, pulmonary embolism, pericarditis, pneumothorax, pneumonia, myocarditis, pleurisy, esophageal rupture   Comorbidities that complicate the patient evaluation: Patients presentation is complicated by their history of erythrocytosis  Social Determinants of Health: Patients tobacco use  increases the complexity of managing their presentation  Additional history obtained: Records reviewed Care Everywhere/External Records  Lab Tests: I Ordered, and personally interpreted labs.  The pertinent results include: Labs overall unremarkable  Imaging Studies ordered: I ordered imaging studies including X-ray chest  I independently visualized and  interpreted imaging which showed no acute findings I agree with the radiologist interpretation  Cardiac Monitoring: The patient was maintained on a cardiac monitor.  I personally viewed and interpreted the cardiac monitor which showed an underlying rhythm of:  sinus rhythm   Test Considered: Patient is low risk / negative by heart score, therefore do not feel that cardiac admission is indicated. Patient appears PERC negative, will defer PE workup   Reevaluation: After the interventions noted above, I reevaluated the patient and found that they have :improved  Complexity of problems addressed: Patients presentation is most consistent with  acute presentation with potential threat to life or bodily function  Disposition: After consideration of the diagnostic results and the patients response to treatment,  I feel that the patent would benefit from discharge  .    Patient reports feeling improved.  BP is improving.  I am suspicious that some of his symptoms are related to new onset hypertension He is in no acute distress resting comfortably.  I have low suspicion for ACS/PE/dissection. He request to start a new BP med and is trying to get into PCP.  Will also refer to cardiology we discussed strict return precautions     Final diagnoses:  Precordial pain  Primary hypertension    ED Discharge Orders          Ordered    hydrochlorothiazide  (HYDRODIURIL ) 12.5 MG tablet  Daily        05/06/24 0651    Ambulatory referral to Cardiology        05/06/24 0651               Midge Golas, MD 05/06/24 [phone]
